# Patient Record
Sex: Male | Born: 1950 | Race: White | Hispanic: No | Marital: Married | Smoking: Former smoker
Health system: Southern US, Community
[De-identification: ages and names within clinical notes are randomized; demographics above are authoritative.]

## PROBLEM LIST (undated history)

## (undated) DIAGNOSIS — F32A Depression, unspecified: Secondary | ICD-10-CM

## (undated) DIAGNOSIS — E781 Pure hyperglyceridemia: Secondary | ICD-10-CM

## (undated) DIAGNOSIS — G609 Hereditary and idiopathic neuropathy, unspecified: Secondary | ICD-10-CM

## (undated) DIAGNOSIS — R270 Ataxia, unspecified: Secondary | ICD-10-CM

## (undated) DIAGNOSIS — L219 Seborrheic dermatitis, unspecified: Secondary | ICD-10-CM

## (undated) DIAGNOSIS — K7460 Unspecified cirrhosis of liver: Secondary | ICD-10-CM

## (undated) DIAGNOSIS — I10 Essential (primary) hypertension: Secondary | ICD-10-CM

## (undated) DIAGNOSIS — F419 Anxiety disorder, unspecified: Secondary | ICD-10-CM

## (undated) DIAGNOSIS — M109 Gout, unspecified: Secondary | ICD-10-CM

## (undated) HISTORY — DX: Hereditary and idiopathic neuropathy, unspecified: G60.9

## (undated) HISTORY — DX: Depression, unspecified: F32.A

## (undated) HISTORY — DX: Seborrheic dermatitis, unspecified: L21.9

## (undated) HISTORY — DX: Gout, unspecified: M10.9

## (undated) HISTORY — DX: Essential (primary) hypertension: I10

## (undated) HISTORY — DX: Ataxia, unspecified: R27.0

## (undated) HISTORY — DX: Pure hyperglyceridemia: E78.1

## (undated) HISTORY — DX: Unspecified cirrhosis of liver: K74.60

## (undated) HISTORY — DX: Anxiety disorder, unspecified: F41.9

---

## 1998-05-28 ENCOUNTER — Encounter: Payer: Self-pay | Admitting: Internal Medicine

## 2001-04-20 ENCOUNTER — Encounter: Payer: Self-pay | Admitting: Internal Medicine

## 2005-09-14 ENCOUNTER — Ambulatory Visit: Payer: Self-pay | Admitting: Ophthalmology

## 2005-09-27 DIAGNOSIS — R270 Ataxia, unspecified: Secondary | ICD-10-CM

## 2005-09-27 HISTORY — DX: Ataxia, unspecified: R27.0

## 2005-10-16 ENCOUNTER — Other Ambulatory Visit: Payer: Self-pay

## 2005-10-17 ENCOUNTER — Inpatient Hospital Stay: Payer: Self-pay | Admitting: Internal Medicine

## 2005-10-28 ENCOUNTER — Ambulatory Visit: Payer: Self-pay | Admitting: Internal Medicine

## 2005-11-27 ENCOUNTER — Ambulatory Visit: Payer: Self-pay | Admitting: Internal Medicine

## 2005-12-24 ENCOUNTER — Ambulatory Visit: Payer: Self-pay | Admitting: Internal Medicine

## 2006-03-29 ENCOUNTER — Ambulatory Visit: Payer: Self-pay | Admitting: Internal Medicine

## 2006-10-08 ENCOUNTER — Ambulatory Visit: Payer: Self-pay | Admitting: Internal Medicine

## 2007-02-08 DIAGNOSIS — G621 Alcoholic polyneuropathy: Secondary | ICD-10-CM | POA: Insufficient documentation

## 2007-02-08 DIAGNOSIS — G629 Polyneuropathy, unspecified: Secondary | ICD-10-CM

## 2007-02-08 DIAGNOSIS — K7460 Unspecified cirrhosis of liver: Secondary | ICD-10-CM

## 2007-02-08 DIAGNOSIS — I10 Essential (primary) hypertension: Secondary | ICD-10-CM | POA: Insufficient documentation

## 2007-02-08 DIAGNOSIS — M109 Gout, unspecified: Secondary | ICD-10-CM | POA: Insufficient documentation

## 2007-02-08 DIAGNOSIS — E781 Pure hyperglyceridemia: Secondary | ICD-10-CM | POA: Insufficient documentation

## 2007-02-11 ENCOUNTER — Ambulatory Visit: Payer: Self-pay | Admitting: Internal Medicine

## 2007-02-11 DIAGNOSIS — L570 Actinic keratosis: Secondary | ICD-10-CM | POA: Insufficient documentation

## 2007-02-14 LAB — CONVERTED CEMR LAB
ALT: 27 units/L (ref 0–53)
Alkaline Phosphatase: 88 units/L (ref 39–117)
Bilirubin, Direct: 0.1 mg/dL (ref 0.0–0.3)
CO2: 25 meq/L (ref 19–32)
Creatinine, Ser: 0.83 mg/dL (ref 0.40–1.50)
Phosphorus: 2.9 mg/dL (ref 2.3–4.6)
Sodium: 138 meq/L (ref 135–145)
Total Bilirubin: 0.5 mg/dL (ref 0.3–1.2)
Total Protein: 7.5 g/dL (ref 6.0–8.3)

## 2007-10-17 ENCOUNTER — Telehealth (INDEPENDENT_AMBULATORY_CARE_PROVIDER_SITE_OTHER): Payer: Self-pay | Admitting: *Deleted

## 2007-10-18 ENCOUNTER — Telehealth (INDEPENDENT_AMBULATORY_CARE_PROVIDER_SITE_OTHER): Payer: Self-pay | Admitting: *Deleted

## 2008-07-26 ENCOUNTER — Ambulatory Visit: Payer: Self-pay | Admitting: Internal Medicine

## 2009-01-15 ENCOUNTER — Ambulatory Visit: Payer: Self-pay | Admitting: Internal Medicine

## 2009-01-17 LAB — CONVERTED CEMR LAB
ALT: 17 units/L (ref 0–53)
AST: 27 units/L (ref 0–37)
Albumin: 4 g/dL (ref 3.5–5.2)
Alkaline Phosphatase: 59 units/L (ref 39–117)
BUN: 9 mg/dL (ref 6–23)
CO2: 28 meq/L (ref 19–32)
Chloride: 108 meq/L (ref 96–112)
Lymphocytes Relative: 30.3 % (ref 12.0–46.0)
MCHC: 33.7 g/dL (ref 30.0–36.0)
MCV: 94.1 fL (ref 78.0–100.0)
Neutrophils Relative %: 58.5 % (ref 43.0–77.0)
Phosphorus: 3.4 mg/dL (ref 2.3–4.6)
Platelets: 184 10*3/uL (ref 150.0–400.0)
Potassium: 4.3 meq/L (ref 3.5–5.1)
RBC: 4.52 M/uL (ref 4.22–5.81)
RDW: 12.1 % (ref 11.5–14.6)
Total Bilirubin: 0.7 mg/dL (ref 0.3–1.2)

## 2009-01-24 ENCOUNTER — Ambulatory Visit: Payer: Self-pay | Admitting: Internal Medicine

## 2009-02-11 ENCOUNTER — Other Ambulatory Visit: Payer: Self-pay

## 2009-07-29 ENCOUNTER — Ambulatory Visit: Payer: Self-pay | Admitting: Internal Medicine

## 2010-07-30 NOTE — Assessment & Plan Note (Signed)
Summary: F/U/CLE   Vital Signs:  Patient profile:   60 year old male Weight:      245 pounds Temp:     98.4 degrees F oral Pulse rate:   72 / minute Pulse rhythm:   regular BP sitting:   138 / 68  (left arm) Cuff size:   large  Vitals Entered By: Mervin Hack CMA Duncan Dull) (July 29, 2009 4:12 PM) CC: follow-up visit   History of Present Illness: Doing well Did get hit by garage door when he was adjusting it yesterday Sore spot but no sig bleeding Didn't knock him down or out  Weight is up 8# has gained weight from eating during holidays Mild fluid in legs May have fat but no major fluid in his abdomen  No abdominal pain  No alcohol still  Allergies: 1)  * Prednisone  Past History:  Past medical, surgical, family and social histories (including risk factors) reviewed for relevance to current acute and chronic problems.  Past Medical History: Reviewed history from 02/08/2007 and no changes required. Cirrhosis Gout Hypertension Peripheral neuropathy Hypertriglyceridemia  Past Surgical History: Reviewed history from 02/08/2007 and no changes required. Ataxia/hepatic enceph/ peripheral neuropathy 04/07  Family History: Reviewed history from 02/08/2007 and no changes required. Dad died @91  heart aneurysm, had multiple CVAs also Mom had HTN Aunt died of renal failure Cousin--suicide  Social History: Reviewed history from 02/08/2007 and no changes required. 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   Neck:  supple, no masses, and no thyromegaly.   Lungs:  normal respiratory effort and normal breath sounds.   Heart:  normal rate, regular rhythm, no murmur, and no gallop.     Abdomen:  soft, non-tender, no masses, no hepatomegaly, and no splenomegaly.  No apparent ascites Extremities:  no sig edema Psych:  normally interactive, good eye contact, not anxious appearing, and not depressed appearing.     Impression & Recommendations:  Problem # 1:  CIRRHOSIS (ICD-571.5) Assessment Comment Only doing well weight gain seems to be calories, not fluid discussed BW all fine last time--will defer till next time  His updated medication list for this problem includes:    Spironolactone 25 Mg Tabs (Spironolactone) .Marland Kitchen... 2 tablet by mouth once a day  Complete Medication List: 1)  Folic Acid 1 Mg Tabs (Folic acid) .... Take one by mouth once a day 2)  Spironolactone 25 Mg Tabs (Spironolactone) .... 2 tablet by mouth once a day 3)  B-100 Tabs (Vitamins-lipotropics) .... Take one by mouth once a day 4)  Gabapentin 300 Mg Caps (Gabapentin) .... Take one by mouth at bedtime 5)  Advil 200 Mg Caps (Ibuprofen) .... As needed  Other Orders: Flu Vaccine 29yrs + (16109) Admin 1st Vaccine (60454)  Patient Instructions: 1)  Please schedule a follow-up appointment in 6 months .   Current Allergies (reviewed today): * PREDNISONE   Immunizations Administered:  Influenza Vaccine # 1:    Vaccine Type: Fluvax 3+    Site: left deltoid    Mfr: GlaxoSmithKline    Dose: 0.5 ml    Route: IM    Given by: Mervin Hack CMA (AAMA)    Exp. Date: 12/26/2009    Lot #: [id number]    VIS  given: 01/20/07 version given July 29, 2009.  Flu Vaccine Consent Questions:    Do you have a history of severe allergic reactions to this vaccine? no    Any prior history of allergic reactions to egg and/or gelatin? no    Do you have a sensitivity to the preservative Thimersol? no    Do you have a past history of Guillan-Barre Syndrome? no    Do you currently have an acute febrile illness? no    Have you ever had a severe reaction to latex? no    Vaccine information given and explained to  patient? yes

## 2010-08-11 ENCOUNTER — Telehealth: Payer: Self-pay | Admitting: Internal Medicine

## 2010-08-20 NOTE — Progress Notes (Signed)
Summary: Need appt  Phone Note Outgoing Call   Call placed by: Mervin Hack CMA Duncan Dull),  August 11, 2010 8:08 AM Call placed to: Patient Summary of Call: calling pt to advise he needs to make a follow-up appt in order to get refills, if pt makes appt then we can give refills enough to last until appt. Initial call taken by: Mervin Hack CMA Duncan Dull),  August 11, 2010 8:14 AM  Follow-up for Phone Call        left message on machine at home for patient to return my call.  DeShannon Smith CMA (AAMA)  August 11, 2010 8:16 AM   left message on machine at home for patient to return my call.  DeShannon Smith CMA Duncan Dull)  August 12, 2010 12:57 PM   pt still haven't returned my call, will send note to pharmacy. Follow-up by: Mervin Hack CMA (AAMA),  August 13, 2010 10:45 AM

## 2010-08-21 ENCOUNTER — Encounter: Payer: Self-pay | Admitting: Internal Medicine

## 2010-08-21 ENCOUNTER — Ambulatory Visit (INDEPENDENT_AMBULATORY_CARE_PROVIDER_SITE_OTHER): Payer: Managed Care, Other (non HMO) | Admitting: Internal Medicine

## 2010-08-21 ENCOUNTER — Other Ambulatory Visit: Payer: Self-pay | Admitting: Internal Medicine

## 2010-08-21 DIAGNOSIS — I10 Essential (primary) hypertension: Secondary | ICD-10-CM

## 2010-08-21 DIAGNOSIS — G609 Hereditary and idiopathic neuropathy, unspecified: Secondary | ICD-10-CM

## 2010-08-21 DIAGNOSIS — I739 Peripheral vascular disease, unspecified: Secondary | ICD-10-CM | POA: Insufficient documentation

## 2010-08-21 DIAGNOSIS — K7460 Unspecified cirrhosis of liver: Secondary | ICD-10-CM

## 2010-08-22 LAB — HEPATIC FUNCTION PANEL
ALT: 20 U/L (ref 0–53)
Albumin: 4.5 g/dL (ref 3.5–5.2)
Total Bilirubin: 0.6 mg/dL (ref 0.3–1.2)

## 2010-08-22 LAB — CBC WITH DIFFERENTIAL/PLATELET
Basophils Absolute: 0 10*3/uL (ref 0.0–0.1)
Eosinophils Relative: 2.4 % (ref 0.0–5.0)
HCT: 43.2 % (ref 39.0–52.0)
Lymphs Abs: 1.9 10*3/uL (ref 0.7–4.0)
MCV: 93.3 fl (ref 78.0–100.0)
Monocytes Absolute: 0.3 10*3/uL (ref 0.1–1.0)
Neutro Abs: 5.2 10*3/uL (ref 1.4–7.7)
Platelets: 227 10*3/uL (ref 150.0–400.0)
RDW: 12.5 % (ref 11.5–14.6)

## 2010-08-22 LAB — RENAL FUNCTION PANEL
BUN: 9 mg/dL (ref 6–23)
CO2: 30 mEq/L (ref 19–32)
Calcium: 9.9 mg/dL (ref 8.4–10.5)
Creatinine, Ser: 1 mg/dL (ref 0.4–1.5)

## 2010-08-22 LAB — TSH: TSH: 1 u[IU]/mL (ref 0.35–5.50)

## 2010-08-26 NOTE — Assessment & Plan Note (Signed)
Summary: RENEW MEDS- FOLLOW UP   Vital Signs:  Patient profile:   60 year old male Weight:      245 pounds BMI:     36.58 Temp:     99.1 degrees F oral Pulse rate:   82 / minute Pulse rhythm:   regular BP sitting:   132 / 70  (left arm) Cuff size:   large  Vitals Entered By: Mervin Hack CMA Duncan Dull) (August 21, 2010 4:04 PM) CC: medication refill   History of Present Illness: DOIng well Has been getting dry in nose and getting nosebleeds skips the spironolactone occ and it helps did tell him neosporinn is okay  Tried to walk but has bad cramping easily able to get on bicycle though--rides around his block. Feels steady. Can go 2 miles Feet are still numb--still somewhat unstable on feet due to this does have swelling in feet if he is up for a while  No sig DOE but will tire himself on the bicycle No chest pain  No heartburn No stomach trouble  No alcohol at all for some time  Allergies: 1)  * Prednisone  Past History:  Past medical, surgical, family and social histories (including risk factors) reviewed for relevance to current acute and chronic problems.  Past Medical History: Reviewed history from 02/08/2007 and no changes required. Cirrhosis Gout Hypertension Peripheral neuropathy Hypertriglyceridemia  Past Surgical History: Reviewed history from 02/08/2007 and no changes required. Ataxia/hepatic enceph/ peripheral neuropathy 13-Oct-2022  Family History: Dad died @91  heart aneurysm, had multiple CVAs also Mom had HTN. Died @99  Aunt died of renal failure Cousin--suicide  Social History: Reviewed history from 02/08/2007 and no changes required. 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   Neck:  supple, no masses, no  thyromegaly, and no cervical lymphadenopathy.   Lungs:  normal respiratory effort, no intercostal retractions, no accessory muscle use, and normal breath sounds.   Heart:  normal rate, regular rhythm, no murmur, and no gallop.   Abdomen:  protuberant but no apparent ascites Pulses:  1+ on left not palpable on right Extremities:  no edema Psych:  normally interactive, good eye contact, not anxious appearing, and not depressed appearing.     Impression & Recommendations:  Problem # 1:  CLAUDICATION (ICD-443.9) Assessment New bilateral and left pulse strong he doesn't want further intervention regardless and doesn't want testing  Problem # 2:  PERIPHERAL NEUROPATHY (ICD-356.9) Assessment: Unchanged hard walking due to sensory problems no changes rarely uses the gabapentin  Problem # 3:  HYPERTENSION (ICD-401.9) Assessment: Unchanged  BP is fine  His updated medication list for this problem includes:    Spironolactone 25 Mg Tabs (Spironolactone) .Marland Kitchen... 2 tablet by mouth once a day  BP today: 132/70 Prior BP: 138/68 (07/29/2009)  Labs Reviewed: K+: 4.3 (01/15/2009) Creat: : 0.8 (01/15/2009)     Orders: TLB-Renal Function Panel (80069-RENAL) TLB-CBC Platelet - w/Differential (85025-CBCD) TLB-Hepatic/Liver Function Pnl (80076-HEPATIC) TLB-TSH (Thyroid Stimulating Hormone) (84443-TSH) Venipuncture (04540)  Problem # 4:  CIRRHOSIS (ICD-571.5) Assessment: Unchanged appears quiet since he is not drinking  His updated medication list for this problem includes:    Spironolactone 25 Mg Tabs (Spironolactone) .Marland Kitchen... 2 tablet by mouth once a day  Complete Medication List: 1)  Gabapentin 300 Mg  Caps (Gabapentin) .... Take one by mouth at bedtime 2)  Folic Acid 1 Mg Tabs (Folic acid) .... Take one by mouth once a day 3)  Spironolactone 25 Mg Tabs (Spironolactone) .... 2 tablet by mouth once a day 4)  B-100 Tabs (Vitamins-lipotropics) .... Take one by mouth once a day 5)  Advil 200  Mg Caps (Ibuprofen) .... As needed  Contraindications/Deferment of Procedures/Staging:    Test/Procedure: Colonoscopy    Reason for deferment: patient declined   Patient Instructions: 1)  Please schedule a follow-up appointment in 1 year.  Prescriptions: SPIRONOLACTONE 25 MG TABS (SPIRONOLACTONE) 2 tablet by mouth once a day  #60 Tablet x 11   Entered and Authorized by:   Cindee Salt MD   Signed by:   Cindee Salt MD on 08/21/2010   Method used:   Electronically to        [id number] (retail)       [address]       [place], [place]  [address]       Ph: [phone] or [phone]       Fax: [phone]   RxID:   [id number] FOLIC ACID 1 MG TABS (FOLIC ACID) Take one by mouth once a day  #30 Tablet x 11   Entered and Authorized by:   Cindee Salt MD   Signed by:   Cindee Salt MD on 08/21/2010   Method used:   Electronically to        [id number] (retail)       [address]       [place], [place]  [address]       Ph: [phone] or [phone]       Fax: [phone]   RxID:   [id number]    Orders Added: 1)  Est. Patient Level IV [60737] 2)  TLB-Renal Function Panel [80069-RENAL] 3)  TLB-CBC Platelet - w/Differential [85025-CBCD] 4)  TLB-Hepatic/Liver Function Pnl [80076-HEPATIC] 5)  TLB-TSH (Thyroid Stimulating Hormone) [84443-TSH] 6)  Venipuncture [10626]    Current Allergies (reviewed today): * PREDNISONE

## 2010-12-01 ENCOUNTER — Other Ambulatory Visit: Payer: Self-pay | Admitting: *Deleted

## 2010-12-01 MED ORDER — SPIRONOLACTONE 25 MG PO TABS: 50.0000 mg | ORAL_TABLET | Freq: Every day | ORAL | Status: DC

## 2011-01-20 ENCOUNTER — Telehealth: Payer: Self-pay | Admitting: *Deleted

## 2011-01-20 NOTE — Telephone Encounter (Signed)
Pt has dropped off a form for a handicapped placard.  He needs this by 7/31 and Dr. Alphonsus Sias wont be back until 8/06.  Form is on your desk.

## 2011-01-21 NOTE — Telephone Encounter (Signed)
Filled out, in my outbox.

## 2011-01-21 NOTE — Telephone Encounter (Signed)
Left message on machine at home for patient to pick up handicapped sticker.  Will be left at front desk.

## 2011-06-02 ENCOUNTER — Telehealth: Payer: Self-pay | Admitting: *Deleted

## 2011-06-02 NOTE — Telephone Encounter (Signed)
Patient called stating that he needs information for a health screening form that he needs to complete for his employer. Patient needs information from his last physical in January. Patient needs his height, weight, BP, glucose and cholesterol information. Please call patient with this information.

## 2011-06-04 NOTE — Telephone Encounter (Signed)
Spoke with patient and gave him all his information he needed.

## 2011-07-16 ENCOUNTER — Ambulatory Visit: Payer: Managed Care, Other (non HMO) | Admitting: Internal Medicine

## 2011-08-13 ENCOUNTER — Ambulatory Visit: Payer: Managed Care, Other (non HMO) | Admitting: Internal Medicine

## 2011-08-20 ENCOUNTER — Encounter: Payer: Self-pay | Admitting: Internal Medicine

## 2011-08-20 ENCOUNTER — Ambulatory Visit (INDEPENDENT_AMBULATORY_CARE_PROVIDER_SITE_OTHER): Payer: Managed Care, Other (non HMO) | Admitting: Internal Medicine

## 2011-08-20 VITALS — BP 148/80 | HR 79 | Temp 97.8°F | Wt 262.0 lb

## 2011-08-20 DIAGNOSIS — M109 Gout, unspecified: Secondary | ICD-10-CM

## 2011-08-20 DIAGNOSIS — Z23 Encounter for immunization: Secondary | ICD-10-CM

## 2011-08-20 DIAGNOSIS — E781 Pure hyperglyceridemia: Secondary | ICD-10-CM

## 2011-08-20 DIAGNOSIS — K7460 Unspecified cirrhosis of liver: Secondary | ICD-10-CM

## 2011-08-20 DIAGNOSIS — I10 Essential (primary) hypertension: Secondary | ICD-10-CM

## 2011-08-20 DIAGNOSIS — G609 Hereditary and idiopathic neuropathy, unspecified: Secondary | ICD-10-CM

## 2011-08-20 MED ORDER — SPIRONOLACTONE 25 MG PO TABS: 50.0000 mg | ORAL_TABLET | Freq: Every day | ORAL | Status: DC

## 2011-08-20 NOTE — Patient Instructions (Signed)
Please check to see if your insurance will cover a shingles vaccine (zostavax)

## 2011-08-20 NOTE — Assessment & Plan Note (Signed)
Has been quiet 

## 2011-08-20 NOTE — Assessment & Plan Note (Signed)
Goes back to alcohol damage Still on B vitamins No ataxia---but hard to walk exercise

## 2011-08-20 NOTE — Progress Notes (Signed)
  Subjective:    Patient ID: Caleb Arellano, male    DOB: Oct 28, 1950, 61 y.o.   MRN: [id number]  HPI Doing well  Hurt back around Thanksgiving Hasn't been doing any exercise---very sedentary  Still not drinking Occ left leg edema Still skips spironolactone but edema will worsen Gets dry eyes and mouth from the med Uses neosporin in nose  Not able to find work still Wife works On disability  Current Outpatient Prescriptions on File Prior to Visit  Medication Sig Dispense Refill  . spironolactone (ALDACTONE) 25 MG tablet Take 2 tablets (50 mg total) by mouth daily.  60 tablet  1    Allergies  Allergen Reactions  . Prednisone     REACTION: blurred vision    Past Medical History  Diagnosis Date  . Cirrhosis of liver without mention of alcohol   . Gout, unspecified   . Hypertension   . Unspecified hereditary and idiopathic peripheral neuropathy   . Pure hyperglyceridemia   . Ataxia 04/07    hepatic enceph/ peripheral neuropathy    No past surgical history on file.  Family History  Problem Relation Age of Onset  . Hypertension Mother     History   Social History  . Marital Status: Married    Spouse Name: N/A    Number of Children: 3  . Years of Education: N/A   Occupational History  . disabled    Social History Main Topics  . Smoking status: Former Smoker    Types: Cigarettes  . Smokeless tobacco: Never Used  . Alcohol Use: No     alcoholic, stopped recently  . Drug Use: No  . Sexually Active: Not on file   Other Topics Concern  . Not on file   Social History Narrative  . No narrative on file   Review of Systems Appetite is fine Sleeps well Weight up 17# Mood is okay    Objective:   Physical Exam  Constitutional: He appears well-developed and well-nourished. No distress.  Neck: Normal range of motion. Neck supple.  Cardiovascular: Normal rate, regular rhythm, normal heart sounds and intact distal pulses.  Exam reveals no gallop.   No murmur  heard. Pulmonary/Chest: Effort normal and breath sounds normal. No respiratory distress. He has no wheezes. He has no rales.  Abdominal: Soft. There is no tenderness.       Very large but appears to be fat--not ascites  Musculoskeletal: He exhibits no edema and no tenderness.  Lymphadenopathy:    He has no cervical adenopathy.  Psychiatric: He has a normal mood and affect. His behavior is normal. Judgment and thought content normal.          Assessment & Plan:

## 2011-08-20 NOTE — Assessment & Plan Note (Signed)
BP Readings from Last 3 Encounters:  08/20/11 148/80  08/21/10 132/70  07/29/09 138/68   Generally okay No meds for now

## 2011-08-20 NOTE — Assessment & Plan Note (Signed)
Still is asymptomatic Does well on the spironolactone---otherwise he holds fluid Will continue Check labs

## 2011-08-21 LAB — BASIC METABOLIC PANEL
BUN: 10 mg/dL (ref 6–23)
Calcium: 9.3 mg/dL (ref 8.4–10.5)
Chloride: 103 mEq/L (ref 96–112)
Creatinine, Ser: 0.9 mg/dL (ref 0.4–1.5)

## 2011-08-21 LAB — LIPID PANEL
HDL: 42.5 mg/dL (ref >39.00)
LDL Cholesterol: 96 mg/dL (ref 0–99)
Total CHOL/HDL Ratio: 4
Triglycerides: 144 mg/dL (ref 0.0–149.0)
VLDL: 28.8 mg/dL (ref 0.0–40.0)

## 2011-08-21 LAB — CBC WITH DIFFERENTIAL/PLATELET
Eosinophils Absolute: 0.3 10*3/uL (ref 0.0–0.7)
Eosinophils Relative: 3.1 % (ref 0.0–5.0)
Lymphocytes Relative: 23.6 % (ref 12.0–46.0)
MCV: 92 fl (ref 78.0–100.0)
Monocytes Absolute: 0.5 10*3/uL (ref 0.1–1.0)
Neutrophils Relative %: 66.9 % (ref 43.0–77.0)
Platelets: 222 10*3/uL (ref 150.0–400.0)
WBC: 8.6 10*3/uL (ref 4.5–10.5)

## 2011-08-21 LAB — HEPATIC FUNCTION PANEL
AST: 28 U/L (ref 0–37)
Bilirubin, Direct: 0.1 mg/dL (ref 0.0–0.3)
Total Bilirubin: 0.6 mg/dL (ref 0.3–1.2)

## 2011-08-21 LAB — TSH: TSH: 0.64 u[IU]/mL (ref 0.35–5.50)

## 2011-12-01 ENCOUNTER — Other Ambulatory Visit: Payer: Self-pay | Admitting: *Deleted

## 2011-12-01 MED ORDER — SPIRONOLACTONE 25 MG PO TABS: 50.0000 mg | ORAL_TABLET | Freq: Every day | ORAL | Status: DC

## 2011-12-01 MED ORDER — FOLIC ACID 1 MG PO TABS: 1.0000 mg | ORAL_TABLET | Freq: Every day | ORAL | Status: DC

## 2012-05-06 ENCOUNTER — Telehealth: Payer: Self-pay

## 2012-05-06 NOTE — Telephone Encounter (Signed)
Pt request last lab results for wellness form pt has, given to pt; pt also has to chose new insurance and wants to know what ins.  companies our office is affilitated with. Advised pt to call [phone] for that info.pt voiced understanding.

## 2012-06-08 ENCOUNTER — Ambulatory Visit (INDEPENDENT_AMBULATORY_CARE_PROVIDER_SITE_OTHER): Payer: Managed Care, Other (non HMO)

## 2012-06-08 DIAGNOSIS — Z23 Encounter for immunization: Secondary | ICD-10-CM

## 2013-02-06 ENCOUNTER — Other Ambulatory Visit: Payer: Self-pay | Admitting: Internal Medicine

## 2013-02-06 ENCOUNTER — Other Ambulatory Visit: Payer: Self-pay | Admitting: *Deleted

## 2013-02-06 NOTE — Telephone Encounter (Signed)
Okay #60 x 0 and have him set up appt within a month

## 2013-02-06 NOTE — Telephone Encounter (Signed)
[facility] for spiron[facility]actone.  Pt has not been seen since 07/2011.  Please advise if ok to refill one time.

## 2013-02-07 MED ORDER — SPIRONOLACTONE 25 MG PO TABS: 50.0000 mg | ORAL_TABLET | Freq: Every day | ORAL | Status: DC

## 2013-02-07 NOTE — Telephone Encounter (Signed)
.  left message to have patient return my call. 2nd number in chart, cell number has been disconnected Will leave message at pharmacy rx sent to pharmacy by e-script

## 2013-02-10 MED ORDER — FOLIC ACID 1 MG PO TABS: 1.0000 mg | ORAL_TABLET | Freq: Every day | ORAL | Status: DC

## 2013-02-10 NOTE — Addendum Note (Signed)
Addended by: Patience Musca on: 02/10/2013 02:55 PM   Modules accepted: Orders

## 2013-02-10 NOTE — Telephone Encounter (Addendum)
Pt left v/m; pt said had spoken with [facility] today and Folic Acid was refilled to [facility]. Pt was at [facility] and was told refill was denied. Duce request Folic Acid to be sent to [facility] and pt will call back for appt with Dr Alphonsus Sias. Pt request cb.

## 2013-02-10 NOTE — Addendum Note (Signed)
Addended by: Sueanne Margarita on: 02/10/2013 02:58 PM   Modules accepted: Orders

## 2013-04-04 ENCOUNTER — Other Ambulatory Visit: Payer: Self-pay | Admitting: *Deleted

## 2013-06-20 ENCOUNTER — Emergency Department: Payer: Self-pay | Admitting: Internal Medicine

## 2013-06-20 ENCOUNTER — Telehealth: Payer: Self-pay

## 2013-06-20 LAB — HEPATIC FUNCTION PANEL A (ARMC)
Albumin: 4.2 g/dL (ref 3.4–5.0)
Bilirubin, Direct: <0.1 mg/dL (ref 0.00–0.20)
Bilirubin,Total: 0.4 mg/dL (ref 0.2–1.0)
SGOT(AST): 29 U/L (ref 15–37)
Total Protein: 8.2 g/dL (ref 6.4–8.2)

## 2013-06-20 LAB — DIFFERENTIAL
Basophil #: 0.1 10*3/uL (ref 0.0–0.1)
Lymphocyte #: 1.6 10*3/uL (ref 1.0–3.6)
Lymphocyte %: 19.9 %
Monocyte #: 0.7 x10 3/mm (ref 0.2–1.0)

## 2013-06-20 LAB — CBC
HGB: 15.8 g/dL (ref 13.0–18.0)
MCHC: 34.1 g/dL (ref 32.0–36.0)
MCV: 92 fL (ref 80–100)
RBC: 5.06 10*6/uL (ref 4.40–5.90)
RDW: 13 % (ref 11.5–14.5)
WBC: 7.8 10*3/uL (ref 3.8–10.6)

## 2013-06-20 LAB — BASIC METABOLIC PANEL
Anion Gap: 5 — ABNORMAL LOW (ref 7–16)
BUN: 13 mg/dL (ref 7–18)
Calcium, Total: 9.3 mg/dL (ref 8.5–10.1)
Chloride: 103 mmol/L (ref 98–107)
Osmolality: 270 (ref 275–301)
Potassium: 3.5 mmol/L (ref 3.5–5.1)

## 2013-06-20 NOTE — Telephone Encounter (Signed)
Please check on him tomorrow morning 

## 2013-06-20 NOTE — Telephone Encounter (Signed)
Pt left v/m has cirrhosis of liver and is very off balanced this morning. Pt last seen 08/20/11. Pt wanted appt today with Dr Alphonsus Sias so would not have to go to ED. Left v/m for pt to cb. I called [facility] ED registration and pt is checking in to ED now.

## 2013-06-21 ENCOUNTER — Encounter: Payer: Self-pay | Admitting: Internal Medicine

## 2013-06-21 ENCOUNTER — Ambulatory Visit (INDEPENDENT_AMBULATORY_CARE_PROVIDER_SITE_OTHER): Payer: BC Managed Care – PPO | Admitting: Internal Medicine

## 2013-06-21 ENCOUNTER — Ambulatory Visit: Payer: Managed Care, Other (non HMO) | Admitting: Internal Medicine

## 2013-06-21 VITALS — BP 148/80 | HR 83 | Temp 98.3°F | Wt 259.0 lb

## 2013-06-21 DIAGNOSIS — K7460 Unspecified cirrhosis of liver: Secondary | ICD-10-CM

## 2013-06-21 DIAGNOSIS — D239 Other benign neoplasm of skin, unspecified: Secondary | ICD-10-CM

## 2013-06-21 DIAGNOSIS — Z23 Encounter for immunization: Secondary | ICD-10-CM

## 2013-06-21 DIAGNOSIS — I10 Essential (primary) hypertension: Secondary | ICD-10-CM

## 2013-06-21 DIAGNOSIS — R42 Dizziness and giddiness: Secondary | ICD-10-CM | POA: Insufficient documentation

## 2013-06-21 NOTE — Progress Notes (Signed)
Subjective:    Patient ID: Caleb Arellano, male    DOB: Nov 21, 1950, 62 y.o.   MRN: [id number]  HPI Caleb Arellano to ER yesterday  Got up 2 mornings ago---felt dizzy when leaning over to get glasses "Like the world was moving"--actually felt like he was moving Fine if stayed still, bad with movement Trouble standing up straight Felt like when his liver went bad  Trouble with reclining back or standing up To ER because it just wasn't improving No nausea or vomiting  Gait is normal for him Chronic problems with balance---still uses the cane Some headache-relates to reading (ibuprofen helps)  Current Outpatient Prescriptions on File Prior to Visit  Medication Sig Dispense Refill  . folic acid (FOLVITE) 1 MG tablet Take 1 tablet (1 mg total) by mouth daily.  30 tablet  0  . spironolactone (ALDACTONE) 25 MG tablet Take 2 tablets (50 mg total) by mouth daily.  60 tablet  0  . thiamine (VITAMIN B-1) 100 MG tablet Take 100 mg by mouth daily.       No current facility-administered medications on file prior to visit.    Allergies  Allergen Reactions  . Prednisone     REACTION: blurred vision    Past Medical History  Diagnosis Date  . Cirrhosis of liver without mention of alcohol   . Gout, unspecified   . Hypertension   . Unspecified hereditary and idiopathic peripheral neuropathy   . Pure hyperglyceridemia   . Ataxia 04/07    hepatic enceph/ peripheral neuropathy    No past surgical history on file.  Family History  Problem Relation Age of Onset  . Hypertension Mother     History   Social History  . Marital Status: Married    Spouse Name: N/A    Number of Children: 3  . Years of Education: N/A   Occupational History  . disabled    Social History Main Topics  . Smoking status: Former Smoker    Types: Cigarettes  . Smokeless tobacco: Never Used  . Alcohol Use: No     Comment: alcoholic, stopped recently  . Drug Use: No  . Sexual Activity: Not on file   Other  Topics Concern  . Not on file   Social History Narrative  . No narrative on file   Review of Systems Got bruising in left arm from automatic BP cuff yesterday Appetite is still good Weight is stable Has noticed more trouble with distance vision---needs glasses Has irritated lesion on upper chest    Objective:   Physical Exam  Constitutional: He is oriented to person, place, and time. He appears well-developed and well-nourished. No distress.  HENT:  Mouth/Throat: Oropharynx is clear and moist. No oropharyngeal exudate.  TMs and canals are normal  Eyes: Conjunctivae and EOM are normal. Pupils are equal, round, and reactive to light.  Fundi normal No nystagmus  Neck: Normal range of motion. Neck supple.  Cardiovascular: Normal rate, regular rhythm and normal heart sounds.  Exam reveals no gallop.   No murmur heard. Pulmonary/Chest: Effort normal and breath sounds normal. No respiratory distress. He has no wheezes. He has no rales.  Abdominal: Soft. There is no tenderness.  No obvious ascites  Lymphadenopathy:    He has no cervical adenopathy.  Neurological: He is alert and oriented to person, place, and time. He has normal strength. He displays no tremor. No cranial nerve deficit. He exhibits normal muscle tone. He displays a negative Romberg sign. Coordination normal.  Skin:  Mixed dermatofibroma and seb keratosis---total ~46mm on upper chest --over top of sternum  Psychiatric: He has a normal mood and affect. His behavior is normal.          Assessment & Plan:

## 2013-06-21 NOTE — Telephone Encounter (Signed)
Pt coming in today for f/u.

## 2013-06-21 NOTE — Assessment & Plan Note (Signed)
Stable Liver function okay Remains abstinent from alcohol

## 2013-06-21 NOTE — Assessment & Plan Note (Signed)
Treated with liquid nitrogen  45 seconds x 2 Discussed home care

## 2013-06-21 NOTE — Assessment & Plan Note (Signed)
BP Readings from Last 3 Encounters:  06/21/13 148/80  08/20/11 148/80  08/21/10 132/70   Reasonable control for him

## 2013-06-21 NOTE — Assessment & Plan Note (Signed)
Benign neuro exam so nothing to suggest brainstem CVA Will just use the meclizine prn (not so bad now)

## 2013-06-21 NOTE — Progress Notes (Signed)
Pre-visit discussion using our clinic review tool. No additional management support is needed unless otherwise documented below in the visit note.  

## 2013-06-27 ENCOUNTER — Other Ambulatory Visit: Payer: Self-pay | Admitting: Internal Medicine

## 2013-07-03 ENCOUNTER — Other Ambulatory Visit: Payer: Self-pay | Admitting: *Deleted

## 2013-07-03 MED ORDER — SPIRONOLACTONE 25 MG PO TABS: 50.0000 mg | ORAL_TABLET | Freq: Every day | ORAL | Status: DC

## 2013-08-29 ENCOUNTER — Other Ambulatory Visit: Payer: Self-pay | Admitting: Internal Medicine

## 2014-03-21 ENCOUNTER — Other Ambulatory Visit: Payer: Self-pay | Admitting: Internal Medicine

## 2014-04-23 ENCOUNTER — Ambulatory Visit (INDEPENDENT_AMBULATORY_CARE_PROVIDER_SITE_OTHER): Payer: BC Managed Care – PPO | Admitting: Internal Medicine

## 2014-04-23 ENCOUNTER — Encounter: Payer: Self-pay | Admitting: Internal Medicine

## 2014-04-23 VITALS — BP 158/90 | HR 74 | Temp 97.8°F | Ht 68.5 in | Wt 269.0 lb

## 2014-04-23 DIAGNOSIS — I10 Essential (primary) hypertension: Secondary | ICD-10-CM

## 2014-04-23 DIAGNOSIS — Z0001 Encounter for general adult medical examination with abnormal findings: Secondary | ICD-10-CM | POA: Insufficient documentation

## 2014-04-23 DIAGNOSIS — L219 Seborrheic dermatitis, unspecified: Secondary | ICD-10-CM | POA: Insufficient documentation

## 2014-04-23 DIAGNOSIS — F1021 Alcohol dependence, in remission: Secondary | ICD-10-CM

## 2014-04-23 DIAGNOSIS — Z23 Encounter for immunization: Secondary | ICD-10-CM

## 2014-04-23 DIAGNOSIS — Z0000 Encounter for general adult medical examination without abnormal findings: Secondary | ICD-10-CM | POA: Insufficient documentation

## 2014-04-23 DIAGNOSIS — G629 Polyneuropathy, unspecified: Secondary | ICD-10-CM

## 2014-04-23 DIAGNOSIS — K7030 Alcoholic cirrhosis of liver without ascites: Secondary | ICD-10-CM

## 2014-04-23 MED ORDER — HYDROCORTISONE 2.5 % EX CREA: TOPICAL_CREAM | Freq: Two times a day (BID) | CUTANEOUS | Status: DC | PRN

## 2014-04-23 NOTE — Patient Instructions (Addendum)
Try neutrogena T-gel shampoo for your hair.  DASH Eating Plan DASH stands for "Dietary Approaches to Stop Hypertension." The DASH eating plan is a healthy eating plan that has been shown to reduce high blood pressure (hypertension). Additional health benefits may include reducing the risk of type 2 diabetes mellitus, heart disease, and stroke. The DASH eating plan may also help with weight loss. WHAT DO I NEED TO KNOW ABOUT THE DASH EATING PLAN? For the DASH eating plan, you will follow these general guidelines:  Choose foods with a percent daily value for sodium of less than 5% (as listed on the food label).  Use salt-free seasonings or herbs instead of table salt or sea salt.  Check with your health care provider or pharmacist before using salt substitutes.  Eat lower-sodium products, often labeled as "lower sodium" or "no salt added."  Eat fresh foods.  Eat more vegetables, fruits, and low-fat dairy products.  Choose whole grains. Look for the word "whole" as the first word in the ingredient list.  Choose fish and skinless chicken or [place] more often than red meat. Limit fish, poultry, and meat to 6 oz (170 g) each day.  Limit sweets, desserts, sugars, and sugary drinks.  Choose heart-healthy fats.  Limit cheese to 1 oz (28 g) per day.  Eat more home-cooked food and less restaurant, buffet, and fast food.  Limit fried foods.  Cook foods using methods other than frying.  Limit canned vegetables. If you do use them, rinse them well to decrease the sodium.  When eating at a restaurant, ask that your food be prepared with less salt, or no salt if possible. WHAT FOODS CAN I EAT? Seek help from a dietitian for individual calorie needs. Grains Whole grain or whole wheat bread. Brown rice. Whole grain or whole wheat pasta. Quinoa, bulgur, and whole grain cereals. Low-sodium cereals. Corn or whole wheat flour tortillas. Whole grain cornbread. Whole grain crackers. Low-sodium  crackers. Vegetables Fresh or frozen vegetables (raw, steamed, roasted, or grilled). Low-sodium or reduced-sodium tomato and vegetable juices. Low-sodium or reduced-sodium tomato sauce and paste. Low-sodium or reduced-sodium canned vegetables.  Fruits All fresh, canned (in natural juice), or frozen fruits. Meat and Other Protein Products Ground beef (85% or leaner), grass-fed beef, or beef trimmed of fat. Skinless chicken or [place]. Ground chicken or [place]. Pork trimmed of fat. All fish and seafood. Eggs. Dried beans, peas, or lentils. Unsalted nuts and seeds. Unsalted canned beans. Dairy Low-fat dairy products, such as skim or 1% milk, 2% or reduced-fat cheeses, low-fat ricotta or cottage cheese, or plain low-fat yogurt. Low-sodium or reduced-sodium cheeses. Fats and Oils Tub margarines without trans fats. Light or reduced-fat mayonnaise and salad dressings (reduced sodium). Avocado. Safflower, olive, or canola oils. Natural peanut or almond butter. Other Unsalted popcorn and pretzels. The items listed above may not be a complete list of recommended foods or beverages. Contact your dietitian for more options. WHAT FOODS ARE NOT RECOMMENDED? Grains White bread. White pasta. White rice. Refined cornbread. Bagels and croissants. Crackers that contain trans fat. Vegetables Creamed or fried vegetables. Vegetables in a cheese sauce. Regular canned vegetables. Regular canned tomato sauce and paste. Regular tomato and vegetable juices. Fruits Dried fruits. Canned fruit in light or heavy syrup. Fruit juice. Meat and Other Protein Products Fatty cuts of meat. Ribs, chicken wings, bacon, sausage, bologna, salami, chitterlings, fatback, hot dogs, bratwurst, and packaged luncheon meats. Salted nuts and seeds. Canned beans with salt. Dairy Whole or 2% milk, cream, half-and-half,  and cream cheese. Whole-fat or sweetened yogurt. Full-fat cheeses or blue cheese. Nondairy creamers and whipped toppings.  Processed cheese, cheese spreads, or cheese curds. Condiments Onion and garlic salt, seasoned salt, table salt, and sea salt. Canned and packaged gravies. Worcestershire sauce. Tartar sauce. Barbecue sauce. Teriyaki sauce. Soy sauce, including reduced sodium. Steak sauce. Fish sauce. Oyster sauce. Cocktail sauce. Horseradish. Ketchup and mustard. Meat flavorings and tenderizers. Bouillon cubes. Hot sauce. Tabasco sauce. Marinades. Taco seasonings. Relishes. Fats and Oils Butter, stick margarine, lard, shortening, ghee, and bacon fat. Coconut, palm kernel, or palm oils. Regular salad dressings. Other Pickles and olives. Salted popcorn and pretzels. The items listed above may not be a complete list of foods and beverages to avoid. Contact your dietitian for more information. WHERE CAN I FIND MORE INFORMATION? National Heart, Lung, and Blood Institute: travelstabloid.com Document Released: 06/04/2011 Document Revised: 10/30/2013 Document Reviewed: 04/19/2013 [facility] Patient Information 2015 [place], [place]. This information is not intended to replace advice given to you by your health care provider. Make sure you discuss any questions you have with your health care provider. Exercise to Lose Weight Exercise and a healthy diet may help you lose weight. Your doctor may suggest specific exercises. EXERCISE IDEAS AND TIPS  Choose low-cost things you enjoy doing, such as walking, bicycling, or exercising to workout videos.  Take stairs instead of the elevator.  Walk during your lunch break.  Park your car further away from work or school.  Go to a gym or an exercise class.  Start with 5 to 10 minutes of exercise each day. Build up to 30 minutes of exercise 4 to 6 days a week.  Wear shoes with good support and comfortable clothes.  Stretch before and after working out.  Work out until you breathe harder and your heart beats faster.  Drink extra water when  you exercise.  Do not do so much that you hurt yourself, feel dizzy, or get very short of breath. Exercises that burn about 150 calories:  Running 1  miles in 15 minutes.  Playing volleyball for 45 to 60 minutes.  Washing and waxing a car for 45 to 60 minutes.  Playing touch football for 45 minutes.  Walking 1  miles in 35 minutes.  Pushing a stroller 1  miles in 30 minutes.  Playing basketball for 30 minutes.  Raking leaves for 30 minutes.  Bicycling 5 miles in 30 minutes.  Walking 2 miles in 30 minutes.  Dancing for 30 minutes.  Shoveling snow for 15 minutes.  Swimming laps for 20 minutes.  Walking up stairs for 15 minutes.  Bicycling 4 miles in 15 minutes.  Gardening for 30 to 45 minutes.  Jumping rope for 15 minutes.  Washing windows or floors for 45 to 60 minutes. Document Released: 07/18/2010 Document Revised: 09/07/2011 Document Reviewed: 07/18/2010 [facility] Patient Information 2015 [place], [place]. This information is not intended to replace advice given to you by your health care provider. Make sure you discuss any questions you have with your health care provider.

## 2014-04-23 NOTE — Assessment & Plan Note (Signed)
Seems to be better since no alcohol in some time Easy bruising but albumen good on last check  Will recheck labs

## 2014-04-23 NOTE — Assessment & Plan Note (Signed)
Inconsistent with the spironolactone  Will have him go back regularly

## 2014-04-23 NOTE — Assessment & Plan Note (Signed)
Seems stable since not drinking Has balance issues---discussed strategies for this
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
## 2014-04-23 NOTE — Assessment & Plan Note (Addendum)
Will give flu shot Will see if insurance covers zostavax--actually he is not sure he had varicella so will hold off Requests no cancer screening after discussion Needs to work on fitness and healthy eating

## 2014-04-23 NOTE — Addendum Note (Signed)
Addended by: Despina Hidden on: 04/23/2014 05:34 PM   Modules accepted: Orders

## 2014-04-23 NOTE — Progress Notes (Signed)
Pre visit review using our clinic review tool, if applicable. No additional management support is needed unless otherwise documented below in the visit note. 

## 2014-04-24 ENCOUNTER — Telehealth: Payer: Self-pay | Admitting: Internal Medicine

## 2014-04-24 ENCOUNTER — Encounter: Payer: Self-pay | Admitting: *Deleted

## 2014-04-24 DIAGNOSIS — F1021 Alcohol dependence, in remission: Secondary | ICD-10-CM | POA: Insufficient documentation

## 2014-04-24 LAB — CBC WITH DIFFERENTIAL/PLATELET
BASOS ABS: 0.1 10*3/uL (ref 0.0–0.1)
Basophils Relative: 0.8 % (ref 0.0–3.0)
Eosinophils Absolute: 0.4 10*3/uL (ref 0.0–0.7)
Eosinophils Relative: 4.8 % (ref 0.0–5.0)
HEMATOCRIT: 44 % (ref 39.0–52.0)
HEMOGLOBIN: 14.7 g/dL (ref 13.0–17.0)
LYMPHS ABS: 1.8 10*3/uL (ref 0.7–4.0)
Lymphocytes Relative: 24 % (ref 12.0–46.0)
MCHC: 33.4 g/dL (ref 30.0–36.0)
MCV: 91 fl (ref 78.0–100.0)
Monocytes Absolute: 0.8 10*3/uL (ref 0.1–1.0)
Monocytes Relative: 9.9 % (ref 3.0–12.0)
NEUTROS ABS: 4.6 10*3/uL (ref 1.4–7.7)
Neutrophils Relative %: 60.5 % (ref 43.0–77.0)
Platelets: 263 10*3/uL (ref 150.0–400.0)
RBC: 4.84 Mil/uL (ref 4.22–5.81)
RDW: 12.7 % (ref 11.5–15.5)
WBC: 7.7 10*3/uL (ref 4.0–10.5)

## 2014-04-24 LAB — COMPREHENSIVE METABOLIC PANEL
ALT: 23 U/L (ref 0–53)
AST: 31 U/L (ref 0–37)
Albumin: 3.9 g/dL (ref 3.5–5.2)
Alkaline Phosphatase: 65 U/L (ref 39–117)
BILIRUBIN TOTAL: 0.7 mg/dL (ref 0.2–1.2)
BUN: 10 mg/dL (ref 6–23)
CO2: 24 meq/L (ref 19–32)
CREATININE: 1 mg/dL (ref 0.4–1.5)
Calcium: 9.5 mg/dL (ref 8.4–10.5)
Chloride: 102 mEq/L (ref 96–112)
GFR: 77.37 mL/min (ref >60.00)
Glucose, Bld: 91 mg/dL (ref 70–99)
Potassium: 4.4 mEq/L (ref 3.5–5.1)
Sodium: 135 mEq/L (ref 135–145)
Total Protein: 8.1 g/dL (ref 6.0–8.3)

## 2014-04-24 LAB — T4, FREE: Free T4: 0.96 ng/dL (ref 0.60–1.60)

## 2014-04-24 NOTE — Telephone Encounter (Signed)
emmi mailed  °

## 2014-04-24 NOTE — Assessment & Plan Note (Signed)
Has stayed away from alcohol

## 2014-05-22 ENCOUNTER — Ambulatory Visit (INDEPENDENT_AMBULATORY_CARE_PROVIDER_SITE_OTHER): Payer: BC Managed Care – PPO | Admitting: Internal Medicine

## 2014-05-22 ENCOUNTER — Encounter: Payer: Self-pay | Admitting: Internal Medicine

## 2014-05-22 VITALS — BP 126/78 | HR 73 | Temp 98.1°F | Wt 265.0 lb

## 2014-05-22 DIAGNOSIS — R21 Rash and other nonspecific skin eruption: Secondary | ICD-10-CM

## 2014-05-22 MED ORDER — PREDNISONE 10 MG PO TABS: ORAL_TABLET | ORAL | Status: DC

## 2014-05-22 NOTE — Progress Notes (Signed)
Subjective:    Patient ID: Caleb Arellano, male    DOB: Oct 02, 1950, 63 y.o.   MRN: [id number]  HPI  Pt presents to the clinic today with c/o an allergic reaction. He reports this occurred 1 week ago. He broke out in rash on his arm and underarms. The rash has been very itchy. He has not come in contact with anything that he is allergic to that he is aware of. He has not changed lotions, soaps or detergents. He has not started any new medications. He has tried Calomine and Hydrocortisone cream without relief.  Review of Systems      Past Medical History  Diagnosis Date  . Cirrhosis of liver without mention of alcohol   . Gout, unspecified   . Hypertension   . Unspecified hereditary and idiopathic peripheral neuropathy   . Pure hyperglyceridemia   . 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 Seborrheic dermatitis     Current Outpatient Prescriptions  Medication Sig Dispense Refill  . folic acid (FOLVITE) 1 MG tablet TAKE 1 TABLET BY MOUTH EVERY DAY 30 tablet 0  . hydrocortisone 2.5 % cream Apply topically 2 (two) times daily as needed. 30 g 11  . spironolactone (ALDACTONE) 25 MG tablet Take 2 tablets (50 mg total) by mouth daily. 60 tablet 11  . thiamine (VITAMIN B-1) 100 MG tablet Take 100 mg by mouth daily.     No current facility-administered medications for this visit.    Allergies  Allergen Reactions  . Prednisone     REACTION: blurred vision    Family History  Problem Relation Age of Onset  . Hypertension Mother     History   Social History  . Marital Status: Married    Spouse Name: N/A    Number of Children: 3  . Years of Education: N/A   Occupational History  . disabled    Social History Main Topics  . Smoking status: Former Smoker    Types: Cigarettes  . Smokeless tobacco: Never Used  . Alcohol Use: No     Comment: alcoholic, stopped recently  . Drug Use: No  . Sexual Activity: Not on file   Other Topics Concern  . Not on file    Social History Narrative     Constitutional: Denies fever, malaise, fatigue, headache or abrupt weight changes.  Skin: Pt reports rash. Denies lesions or ulcercations.    No other specific complaints in a complete review of systems (except as listed in HPI above).  Objective:   Physical Exam  BP 126/78 mmHg  Pulse 73  Temp(Src) 98.1 F (36.7 C) (Oral)  Wt 265 lb (120.203 kg)  SpO2 97% Wt Readings from Last 3 Encounters:  05/22/14 265 lb (120.203 kg)  04/23/14 269 lb (122.018 kg)  06/21/13 259 lb (117.482 kg)    General: Appears his stated age, well developed, well nourished in NAD. Skin: Scattered maculopapular rash noted on upper arms and shoulders, blanchable. Cardiovascular: Normal rate and rhythm. S1,S2 noted.  No murmur, rubs or gallops noted.  Pulmonary/Chest: Normal effort and positive vesicular breath sounds. No respiratory distress. No wheezes, rales or ronchi noted.    BMET    Component Value Date/Time   NA 135 04/23/2014 1539   K 4.4 04/23/2014 1539   CL 102 04/23/2014 1539   CO2 24 04/23/2014 1539   GLUCOSE 91 04/23/2014 1539   BUN 10 04/23/2014 1539   CREATININE 1.0 04/23/2014 1539   CALCIUM 9.5  04/23/2014 1539    Lipid Panel     Component Value Date/Time   CHOL 167 08/20/2011 1643   TRIG 144.0 08/20/2011 1643   HDL 42.50 08/20/2011 1643   CHOLHDL 4 08/20/2011 1643   VLDL 28.8 08/20/2011 1643   LDLCALC 96 08/20/2011 1643    CBC    Component Value Date/Time   WBC 7.7 04/23/2014 1539   RBC 4.84 04/23/2014 1539   HGB 14.7 04/23/2014 1539   HCT 44.0 04/23/2014 1539   PLT 263.0 04/23/2014 1539   MCV 91.0 04/23/2014 1539   MCHC 33.4 04/23/2014 1539   RDW 12.7 04/23/2014 1539   LYMPHSABS 1.8 04/23/2014 1539   MONOABS 0.8 04/23/2014 1539   EOSABS 0.4 04/23/2014 1539   BASOSABS 0.1 04/23/2014 1539    Hgb A1C No results found for: HGBA1C       Assessment & Plan:   Rash:  ? Cause Will give prednisone taper- he insist that he  does not have an allergy to this. He understands the consequences of taking a medication with a documented allergy. He agrees to continue with prednisone 25 mg Benadryl QHS  RTC as needed or if symptoms persist or worsen

## 2014-05-22 NOTE — Patient Instructions (Signed)

## 2014-05-22 NOTE — Progress Notes (Signed)
Pre visit review using our clinic review tool, if applicable. No additional management support is needed unless otherwise documented below in the visit note. 

## 2014-07-12 ENCOUNTER — Other Ambulatory Visit: Payer: Self-pay | Admitting: Internal Medicine

## 2015-01-05 ENCOUNTER — Other Ambulatory Visit: Payer: Self-pay | Admitting: Internal Medicine

## 2015-01-09 ENCOUNTER — Other Ambulatory Visit: Payer: Self-pay | Admitting: Internal Medicine

## 2015-03-11 ENCOUNTER — Encounter: Payer: Self-pay | Admitting: Internal Medicine

## 2015-03-11 ENCOUNTER — Ambulatory Visit (INDEPENDENT_AMBULATORY_CARE_PROVIDER_SITE_OTHER): Payer: BLUE CROSS/BLUE SHIELD | Admitting: Internal Medicine

## 2015-03-11 VITALS — BP 130/70 | HR 85 | Temp 97.7°F | Wt 276.0 lb

## 2015-03-11 DIAGNOSIS — G629 Polyneuropathy, unspecified: Secondary | ICD-10-CM | POA: Diagnosis not present

## 2015-03-11 DIAGNOSIS — Z23 Encounter for immunization: Secondary | ICD-10-CM | POA: Diagnosis not present

## 2015-03-11 DIAGNOSIS — I10 Essential (primary) hypertension: Secondary | ICD-10-CM

## 2015-03-11 DIAGNOSIS — F1021 Alcohol dependence, in remission: Secondary | ICD-10-CM

## 2015-03-11 DIAGNOSIS — L219 Seborrheic dermatitis, unspecified: Secondary | ICD-10-CM

## 2015-03-11 MED ORDER — KETOCONAZOLE 2 % EX SHAM: 1.0000 "application " | MEDICATED_SHAMPOO | CUTANEOUS | Status: AC

## 2015-03-11 MED ORDER — ZOSTER VACCINE LIVE 19400 UNT/0.65ML ~~LOC~~ SOLR: 0.6500 mL | Freq: Once | SUBCUTANEOUS | Status: DC

## 2015-03-11 NOTE — Assessment & Plan Note (Signed)
May be worsened somewhat Walking okay with a walking stick for balance

## 2015-03-11 NOTE — Progress Notes (Signed)
   Subjective:    Patient ID: Caleb Arellano, male    DOB: 06/02/1951, 64 y.o.   MRN: [id number]  HPI Here for follow up of chronic medical conditions  Some recurrent hives No clear inciting event benedryl helped but knocks him out Cortisone cream helps Discussed trying non sedating antihistamine (he asks about allegra) No mouth swelling or breathing problems  Wart on bottom lip, left forehead, occiput and abdomen--checked all of them and none appear to be actinics. Some seborrhea on scalp still  Still not drinking Not at AA or any other specific interventions  Legs feel more numb Uses walking stick   No chest pain No SOB No dizziness or syncope  Current Outpatient Prescriptions on File Prior to Visit  Medication Sig Dispense Refill  . folic acid (FOLVITE) 1 MG tablet TAKE 1 TABLET BY MOUTH EVERY DAY 30 tablet 5  . hydrocortisone 2.5 % cream Apply topically 2 (two) times daily as needed. 30 g 11  . spironolactone (ALDACTONE) 25 MG tablet TAKE 2 TABLETS BY MOUTH EVERY DAY 60 tablet 2  . thiamine (VITAMIN B-1) 100 MG tablet Take 100 mg by mouth daily.     No current facility-administered medications on file prior to visit.    Allergies  Allergen Reactions  . Prednisone     REACTION: blurred vision    Past Medical History  Diagnosis Date  . Cirrhosis of liver without mention of alcohol   . Gout, unspecified   . Hypertension   . Unspecified hereditary and idiopathic peripheral neuropathy   . Pure hyperglyceridemia   . 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 Seborrheic dermatitis     No past surgical history on file.  Family History  Problem Relation Age of Onset  . Hypertension Mother     Social History   Social History  . Marital Status: Married    Spouse Name: N/A  . Number of Children: 3  . Years of Education: N/A   Occupational History  . disabled    Social History Main Topics  . Smoking status: Former Smoker    Types:  Cigarettes  . Smokeless tobacco: Never Used  . Alcohol Use: No     Comment: alcoholic, stopped recently  . Drug Use: No  . Sexual Activity: Not on file   Other Topics Concern  . Not on file   Social History Narrative   Review of Systems Gained 11# since last visit here Hasn't been doing any exercise Still has sense of movement if he turns his head a certain way    Objective:   Physical Exam  Constitutional: He appears well-developed and well-nourished. No distress.  Cardiovascular: Normal rate, regular rhythm and normal heart sounds.  Exam reveals no gallop.   No murmur heard. Pulmonary/Chest: Effort normal and breath sounds normal. No respiratory distress. He has no wheezes. He has no rales.  Abdominal: Soft. There is no tenderness.  Musculoskeletal: He exhibits no edema.  Skin:  Seborrhea on occiput  Psychiatric: He has a normal mood and affect. His behavior is normal.          Assessment & Plan:

## 2015-03-11 NOTE — Addendum Note (Signed)
Addended by: Despina Hidden on: 03/11/2015 05:18 PM   Modules accepted: Orders

## 2015-03-11 NOTE — Progress Notes (Signed)
Pre visit review using our clinic review tool, if applicable. No additional management support is needed unless otherwise documented below in the visit note. 

## 2015-03-11 NOTE — Assessment & Plan Note (Signed)
BP Readings from Last 3 Encounters:  03/11/15 130/70  05/22/14 126/78  04/23/14 158/90   Good control with just the aldactone

## 2015-03-11 NOTE — Assessment & Plan Note (Signed)
Has kept off all alcohol

## 2015-03-11 NOTE — Assessment & Plan Note (Signed)
Will give ketoconazole shampoo

## 2015-10-11 ENCOUNTER — Other Ambulatory Visit: Payer: Self-pay | Admitting: Internal Medicine

## 2015-11-11 ENCOUNTER — Other Ambulatory Visit: Payer: Self-pay | Admitting: Internal Medicine

## 2015-11-11 NOTE — Telephone Encounter (Signed)
Left a message for patient. He was seen in Sept 2016 and was advised to return in 3 months. Pt has not been seen. He needs to schedule an OV before I can refill his Spironolactone.

## 2015-12-14 ENCOUNTER — Other Ambulatory Visit: Payer: Self-pay | Admitting: Internal Medicine

## 2015-12-16 NOTE — Telephone Encounter (Signed)
Patient returned Caleb Arellano's call.  Please call patient back at [phone].

## 2015-12-16 NOTE — Telephone Encounter (Signed)
Spoke to pt. Made appt for July 10. Sent in refills

## 2015-12-16 NOTE — Telephone Encounter (Signed)
Left message to call office. He was last seen 03-11-15. He was advised to follow-up in 6 months, which was in March. His last refills stated he needed an OV before the next refill. He needs to schedule an appointment to F/U HTN.

## 2016-01-06 ENCOUNTER — Encounter: Payer: Self-pay | Admitting: Internal Medicine

## 2016-01-06 ENCOUNTER — Ambulatory Visit (INDEPENDENT_AMBULATORY_CARE_PROVIDER_SITE_OTHER): Payer: 59 | Admitting: Internal Medicine

## 2016-01-06 VITALS — BP 140/86 | HR 78 | Temp 98.1°F | Wt 282.0 lb

## 2016-01-06 DIAGNOSIS — K7030 Alcoholic cirrhosis of liver without ascites: Secondary | ICD-10-CM

## 2016-01-06 DIAGNOSIS — Z23 Encounter for immunization: Secondary | ICD-10-CM

## 2016-01-06 DIAGNOSIS — G629 Polyneuropathy, unspecified: Secondary | ICD-10-CM

## 2016-01-06 DIAGNOSIS — I10 Essential (primary) hypertension: Secondary | ICD-10-CM

## 2016-01-06 DIAGNOSIS — Z6841 Body Mass Index (BMI) 40.0 and over, adult: Secondary | ICD-10-CM | POA: Insufficient documentation

## 2016-01-06 DIAGNOSIS — Z0000 Encounter for general adult medical examination without abnormal findings: Secondary | ICD-10-CM

## 2016-01-06 DIAGNOSIS — F1021 Alcohol dependence, in remission: Secondary | ICD-10-CM

## 2016-01-06 LAB — CBC WITH DIFFERENTIAL/PLATELET
BASOS ABS: 0.1 10*3/uL (ref 0.0–0.1)
Basophils Relative: 0.7 % (ref 0.0–3.0)
EOS PCT: 2.9 % (ref 0.0–5.0)
Eosinophils Absolute: 0.2 10*3/uL (ref 0.0–0.7)
HEMATOCRIT: 45.2 % (ref 39.0–52.0)
Hemoglobin: 15.2 g/dL (ref 13.0–17.0)
LYMPHS ABS: 2.2 10*3/uL (ref 0.7–4.0)
LYMPHS PCT: 27.8 % (ref 12.0–46.0)
MCHC: 33.7 g/dL (ref 30.0–36.0)
MCV: 89.7 fl (ref 78.0–100.0)
MONOS PCT: 10.3 % (ref 3.0–12.0)
Monocytes Absolute: 0.8 10*3/uL (ref 0.1–1.0)
NEUTROS ABS: 4.5 10*3/uL (ref 1.4–7.7)
NEUTROS PCT: 58.3 % (ref 43.0–77.0)
PLATELETS: 267 10*3/uL (ref 150.0–400.0)
RBC: 5.04 Mil/uL (ref 4.22–5.81)
RDW: 13.2 % (ref 11.5–15.5)
WBC: 7.8 10*3/uL (ref 4.0–10.5)

## 2016-01-06 LAB — COMPREHENSIVE METABOLIC PANEL
ALT: 22 U/L (ref 0–53)
AST: 24 U/L (ref 0–37)
Albumin: 4.5 g/dL (ref 3.5–5.2)
Alkaline Phosphatase: 57 U/L (ref 39–117)
BILIRUBIN TOTAL: 0.4 mg/dL (ref 0.2–1.2)
BUN: 13 mg/dL (ref 6–23)
CALCIUM: 10.1 mg/dL (ref 8.4–10.5)
CHLORIDE: 102 meq/L (ref 96–112)
CO2: 29 meq/L (ref 19–32)
Creatinine, Ser: 0.98 mg/dL (ref 0.40–1.50)
GFR: 81.5 mL/min (ref >60.00)
GLUCOSE: 103 mg/dL — AB (ref 70–99)
Potassium: 4.5 mEq/L (ref 3.5–5.1)
Sodium: 137 mEq/L (ref 135–145)
TOTAL PROTEIN: 7.9 g/dL (ref 6.0–8.3)

## 2016-01-06 NOTE — Assessment & Plan Note (Signed)
Remains abstinent 

## 2016-01-06 NOTE — Addendum Note (Signed)
Addended by: Daralene Milch C on: 01/06/2016 02:15 PM   Modules accepted: SmartSet

## 2016-01-06 NOTE — Assessment & Plan Note (Signed)
Doesn't appear to be holding fluid Intermittent with the spironolactone but back on now

## 2016-01-06 NOTE — Addendum Note (Signed)
Addended by: Pilar Grammes on: 01/06/2016 12:55 PM   Modules accepted: Orders, SmartSet

## 2016-01-06 NOTE — Assessment & Plan Note (Signed)
Info given about eating plan and counseled on trying at least some regular activity

## 2016-01-06 NOTE — Assessment & Plan Note (Signed)
prevnar Discussed cancer screening--he doesn't want to do this

## 2016-01-06 NOTE — Patient Instructions (Signed)
DASH Eating Plan  DASH stands for "Dietary Approaches to Stop Hypertension." The DASH eating plan is a healthy eating plan that has been shown to reduce high blood pressure (hypertension). Additional health benefits may include reducing the risk of type 2 diabetes mellitus, heart disease, and stroke. The DASH eating plan may also help with weight loss.  WHAT DO I NEED TO KNOW ABOUT THE DASH EATING PLAN?  For the DASH eating plan, you will follow these general guidelines:  · Choose foods with a percent daily value for sodium of less than 5% (as listed on the food label).  · Use salt-free seasonings or herbs instead of table salt or sea salt.  · Check with your health care provider or pharmacist before using salt substitutes.  · Eat lower-sodium products, often labeled as "lower sodium" or "no salt added."  · Eat fresh foods.  · Eat more vegetables, fruits, and low-fat dairy products.  · Choose whole grains. Look for the word "whole" as the first word in the ingredient list.  · Choose fish and skinless chicken or turkey more often than red meat. Limit fish, poultry, and meat to 6 oz (170 g) each day.  · Limit sweets, desserts, sugars, and sugary drinks.  · Choose heart-healthy fats.  · Limit cheese to 1 oz (28 g) per day.  · Eat more home-cooked food and less restaurant, buffet, and fast food.  · Limit fried foods.  · Cook foods using methods other than frying.  · Limit canned vegetables. If you do use them, rinse them well to decrease the sodium.  · When eating at a restaurant, ask that your food be prepared with less salt, or no salt if possible.  WHAT FOODS CAN I EAT?  Seek help from a dietitian for individual calorie needs.  Grains  Whole grain or whole wheat bread. Brown rice. Whole grain or whole wheat pasta. Quinoa, bulgur, and whole grain cereals. Low-sodium cereals. Corn or whole wheat flour tortillas. Whole grain cornbread. Whole grain crackers. Low-sodium crackers.  Vegetables  Fresh or frozen vegetables  (raw, steamed, roasted, or grilled). Low-sodium or reduced-sodium tomato and vegetable juices. Low-sodium or reduced-sodium tomato sauce and paste. Low-sodium or reduced-sodium canned vegetables.   Fruits  All fresh, canned (in natural juice), or frozen fruits.  Meat and Other Protein Products  Ground beef (85% or leaner), grass-fed beef, or beef trimmed of fat. Skinless chicken or turkey. Ground chicken or turkey. Pork trimmed of fat. All fish and seafood. Eggs. Dried beans, peas, or lentils. Unsalted nuts and seeds. Unsalted canned beans.  Dairy  Low-fat dairy products, such as skim or 1% milk, 2% or reduced-fat cheeses, low-fat ricotta or cottage cheese, or plain low-fat yogurt. Low-sodium or reduced-sodium cheeses.  Fats and Oils  Tub margarines without trans fats. Light or reduced-fat mayonnaise and salad dressings (reduced sodium). Avocado. Safflower, olive, or canola oils. Natural peanut or almond butter.  Other  Unsalted popcorn and pretzels.  The items listed above may not be a complete list of recommended foods or beverages. Contact your dietitian for more options.  WHAT FOODS ARE NOT RECOMMENDED?  Grains  White bread. White pasta. White rice. Refined cornbread. Bagels and croissants. Crackers that contain trans fat.  Vegetables  Creamed or fried vegetables. Vegetables in a cheese sauce. Regular canned vegetables. Regular canned tomato sauce and paste. Regular tomato and vegetable juices.  Fruits  Dried fruits. Canned fruit in light or heavy syrup. Fruit juice.  Meat and Other Protein   Products  Fatty cuts of meat. Ribs, chicken wings, bacon, sausage, bologna, salami, chitterlings, fatback, hot dogs, bratwurst, and packaged luncheon meats. Salted nuts and seeds. Canned beans with salt.  Dairy  Whole or 2% milk, cream, half-and-half, and cream cheese. Whole-fat or sweetened yogurt. Full-fat cheeses or blue cheese. Nondairy creamers and whipped toppings. Processed cheese, cheese spreads, or cheese  curds.  Condiments  Onion and garlic salt, seasoned salt, table salt, and sea salt. Canned and packaged gravies. Worcestershire sauce. Tartar sauce. Barbecue sauce. Teriyaki sauce. Soy sauce, including reduced sodium. Steak sauce. Fish sauce. Oyster sauce. Cocktail sauce. Horseradish. Ketchup and mustard. Meat flavorings and tenderizers. Bouillon cubes. Hot sauce. Tabasco sauce. Marinades. Taco seasonings. Relishes.  Fats and Oils  Butter, stick margarine, lard, shortening, ghee, and bacon fat. Coconut, palm kernel, or palm oils. Regular salad dressings.  Other  Pickles and olives. Salted popcorn and pretzels.  The items listed above may not be a complete list of foods and beverages to avoid. Contact your dietitian for more information.  WHERE CAN I FIND MORE INFORMATION?  National Heart, Lung, and [employer] Institute: www.nhlbi.nih.gov/health/health-topics/topics/dash/     This information is not intended to replace advice given to you by your health care provider. Make sure you discuss any questions you have with your health care provider.     Document Released: 06/04/2011 Document Revised: 07/06/2014 Document Reviewed: 04/19/2013  Elsevier Interactive Patient Education ©2016 Else[employer] Inc.

## 2016-01-06 NOTE — Progress Notes (Signed)
   Subjective:    Patient ID: Caleb Arellano, male    DOB: 03-16-1951, 65 y.o.   MRN: [id number]  HPI Here for follow up of chronic health conditions  Lost insurance for a while when wife retired She went back to work part time-- and has insurance again  Weight is up 6# Legs are more full but he relates this to not being careful about his eating Doesn't seem to have increased edema Already is down 3# from last week when being more careful  Still has pain in legs Had been more of numbness--but now more of burning Not bad enough to try medications  Remains abstinent No alcohol in years now No AA or other actions  Current Outpatient Prescriptions on File Prior to Visit  Medication Sig Dispense Refill  . folic acid (FOLVITE) 1 MG tablet TAKE 1 TABLET BY MOUTH EVERY DAY. **NEEDS OFFICE VISIT FOR FURTHER FILLS* 30 tablet 0  . hydrocortisone 2.5 % cream Apply topically 2 (two) times daily as needed. 30 g 11  . ketoconazole (NIZORAL) 2 % shampoo Apply 1 application topically 2 (two) times a week. 120 mL 11  . spironolactone (ALDACTONE) 25 MG tablet TAKE 2 TABLETS BY MOUTH EVERY DAY **NEEDS OFFICE VISIT FOR FURTHER FILLS* 60 tablet 0  . thiamine (VITAMIN B-1) 100 MG tablet Take 100 mg by mouth daily.     No current facility-administered medications on file prior to visit.    No Active Allergies  Past Medical History  Diagnosis Date  . Cirrhosis of liver without mention of alcohol   . Gout, unspecified   . Hypertension   . Unspecified hereditary and idiopathic peripheral neuropathy   . Pure hyperglyceridemia   . 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 Seborrheic dermatitis     No past surgical history on file.  Family History  Problem Relation Age of Onset  . Hypertension Mother     Social History   Social History  . Marital Status: Married    Spouse Name: N/A  . Number of Children: 3  . Years of Education: N/A   Occupational History  . disabled      Social History Main Topics  . Smoking status: Former Smoker    Types: Cigarettes  . Smokeless tobacco: Never Used  . Alcohol Use: No     Comment: alcoholic, stopped recently  . Drug Use: No  . Sexual Activity: Not on file   Other Topics Concern  . Not on file   Social History Narrative   Review of Systems  Sleeps well No SOB--but harder to do stuff in the heat No much exercise Bowels are fine Voids well--no problems with stream Gout has been quiet    Objective:   Physical Exam  Constitutional: He appears well-developed and well-nourished. No distress.  Neck: Normal range of motion. Neck supple.  Cardiovascular: Normal rate, regular rhythm, normal heart sounds and intact distal pulses.  Exam reveals no gallop.   No murmur heard. Pulmonary/Chest: Effort normal and breath sounds normal. No respiratory distress. He has no wheezes. He has no rales.  Abdominal: Soft. There is no tenderness.  No apparent ascites  Musculoskeletal:  No sig edema  Lymphadenopathy:    He has no cervical adenopathy.  Skin: No rash noted. No erythema.  Psychiatric: He has a normal mood and affect. His behavior is normal.          Assessment & Plan:

## 2016-01-06 NOTE — Assessment & Plan Note (Signed)
More pain now Prefers no meds still--- but can consider gabapentin only at night if worsens (made him logy)

## 2016-01-06 NOTE — Progress Notes (Signed)
Pre visit review using our clinic review tool, if applicable. No additional management support is needed unless otherwise documented below in the visit note. 

## 2016-01-06 NOTE — Assessment & Plan Note (Signed)
BP Readings from Last 3 Encounters:  01/06/16 140/86  03/11/15 130/70  05/22/14 126/78   BP acceptable  No changes

## 2016-01-13 ENCOUNTER — Encounter: Payer: Self-pay | Admitting: Internal Medicine

## 2016-01-15 DIAGNOSIS — L718 Other rosacea: Secondary | ICD-10-CM | POA: Diagnosis not present

## 2016-01-15 DIAGNOSIS — H353131 Nonexudative age-related macular degeneration, bilateral, early dry stage: Secondary | ICD-10-CM | POA: Diagnosis not present

## 2016-01-15 DIAGNOSIS — H01019 Ulcerative blepharitis unspecified eye, unspecified eyelid: Secondary | ICD-10-CM | POA: Diagnosis not present

## 2016-01-18 ENCOUNTER — Other Ambulatory Visit: Payer: Self-pay | Admitting: Internal Medicine

## 2016-11-30 DIAGNOSIS — H01022 Squamous blepharitis right lower eyelid: Secondary | ICD-10-CM | POA: Diagnosis not present

## 2016-11-30 DIAGNOSIS — H2513 Age-related nuclear cataract, bilateral: Secondary | ICD-10-CM | POA: Diagnosis not present

## 2016-11-30 DIAGNOSIS — H01025 Squamous blepharitis left lower eyelid: Secondary | ICD-10-CM | POA: Diagnosis not present

## 2016-11-30 DIAGNOSIS — H524 Presbyopia: Secondary | ICD-10-CM | POA: Diagnosis not present

## 2016-11-30 DIAGNOSIS — H25011 Cortical age-related cataract, right eye: Secondary | ICD-10-CM | POA: Diagnosis not present

## 2017-03-30 ENCOUNTER — Encounter: Payer: Self-pay | Admitting: Internal Medicine

## 2017-03-30 ENCOUNTER — Ambulatory Visit (INDEPENDENT_AMBULATORY_CARE_PROVIDER_SITE_OTHER): Payer: Medicare Other | Admitting: Internal Medicine

## 2017-03-30 VITALS — BP 136/64 | HR 62 | Temp 97.6°F | Ht 70.0 in | Wt 264.0 lb

## 2017-03-30 DIAGNOSIS — G629 Polyneuropathy, unspecified: Secondary | ICD-10-CM

## 2017-03-30 DIAGNOSIS — Z23 Encounter for immunization: Secondary | ICD-10-CM | POA: Diagnosis not present

## 2017-03-30 DIAGNOSIS — I10 Essential (primary) hypertension: Secondary | ICD-10-CM | POA: Diagnosis not present

## 2017-03-30 DIAGNOSIS — Z7189 Other specified counseling: Secondary | ICD-10-CM | POA: Insufficient documentation

## 2017-03-30 DIAGNOSIS — R42 Dizziness and giddiness: Secondary | ICD-10-CM

## 2017-03-30 DIAGNOSIS — K7030 Alcoholic cirrhosis of liver without ascites: Secondary | ICD-10-CM

## 2017-03-30 DIAGNOSIS — Z0000 Encounter for general adult medical examination without abnormal findings: Secondary | ICD-10-CM

## 2017-03-30 DIAGNOSIS — F1021 Alcohol dependence, in remission: Secondary | ICD-10-CM

## 2017-03-30 DIAGNOSIS — F39 Unspecified mood [affective] disorder: Secondary | ICD-10-CM

## 2017-03-30 LAB — COMPREHENSIVE METABOLIC PANEL
ALK PHOS: 53 U/L (ref 39–117)
ALT: 15 U/L (ref 0–53)
AST: 18 U/L (ref 0–37)
Albumin: 4.4 g/dL (ref 3.5–5.2)
BILIRUBIN TOTAL: 0.5 mg/dL (ref 0.2–1.2)
BUN: 14 mg/dL (ref 6–23)
CO2: 28 mEq/L (ref 19–32)
Calcium: 9.6 mg/dL (ref 8.4–10.5)
Chloride: 103 mEq/L (ref 96–112)
Creatinine, Ser: 0.9 mg/dL (ref 0.40–1.50)
GFR: 89.58 mL/min (ref >60.00)
Glucose, Bld: 96 mg/dL (ref 70–99)
POTASSIUM: 3.9 meq/L (ref 3.5–5.1)
Sodium: 138 mEq/L (ref 135–145)
Total Protein: 7.8 g/dL (ref 6.0–8.3)

## 2017-03-30 LAB — CBC WITH DIFFERENTIAL/PLATELET
BASOS ABS: 0.1 10*3/uL (ref 0.0–0.1)
Basophils Relative: 0.8 % (ref 0.0–3.0)
EOS PCT: 3 % (ref 0.0–5.0)
Eosinophils Absolute: 0.2 10*3/uL (ref 0.0–0.7)
HCT: 46.3 % (ref 39.0–52.0)
HEMOGLOBIN: 15.4 g/dL (ref 13.0–17.0)
Lymphocytes Relative: 20.5 % (ref 12.0–46.0)
Lymphs Abs: 1.5 10*3/uL (ref 0.7–4.0)
MCHC: 33.2 g/dL (ref 30.0–36.0)
MCV: 92.6 fl (ref 78.0–100.0)
MONO ABS: 0.7 10*3/uL (ref 0.1–1.0)
Monocytes Relative: 8.9 % (ref 3.0–12.0)
NEUTROS PCT: 66.8 % (ref 43.0–77.0)
Neutro Abs: 5 10*3/uL (ref 1.4–7.7)
PLATELETS: 239 10*3/uL (ref 150.0–400.0)
RBC: 5 Mil/uL (ref 4.22–5.81)
RDW: 13.2 % (ref 11.5–15.5)
WBC: 7.5 10*3/uL (ref 4.0–10.5)

## 2017-03-30 MED ORDER — MECLIZINE HCL 25 MG PO TABS: 25.0000 mg | ORAL_TABLET | Freq: Three times a day (TID) | ORAL | 11 refills | Status: DC | PRN

## 2017-03-30 MED ORDER — SPIRONOLACTONE 25 MG PO TABS: 50.0000 mg | ORAL_TABLET | ORAL | 11 refills | Status: DC

## 2017-03-30 NOTE — Progress Notes (Signed)
Subjective:    Patient ID: Caleb Arellano, male    DOB: August 22, 1950, 66 y.o.   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) Vision is okay--some trouble with right eye though Independent with instrumental ADLs Mild memory issues--nothing worrisome  Having trouble with vertigo--- if he walks looking down Goes back 3-4 months and mildly improving If he lies on his back---can get sensation of tilting Chronic tinnitus Did get large piece of wax out of left ear--hearing better after this came out [facility] okay as long as he is looking forward Ongoing neuropathy-- still has to be careful turning his head, etc  Continues to be abstinent from alcohol No confusion Still gets some swelling in his ankles Has cut back the spironolactone to every other day  No chest pain No SOB No syncope  Mild anxiety Certain things set him off  Occasional anxiety attacks---doesn't want medications  Current Outpatient Prescriptions on File Prior to Visit  Medication Sig Dispense Refill  . folic acid (FOLVITE) 1 MG tablet Take 1 tablet (1 mg total) by mouth daily. 30 tablet 11  . hydrocortisone 2.5 % cream Apply topically 2 (two) times daily as needed. 30 g 11  . ketoconazole (NIZORAL) 2 % shampoo Apply 1 application topically 2 (two) times a week. 120 mL 11  . thiamine (VITAMIN B-1) 100 MG tablet Take 100 mg by mouth daily.     No current facility-administered medications on file prior to visit.     No Active Allergies  Past Medical History:  Diagnosis Date  . 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 Cirrhosis of liver without mention of alcohol   . Gout,  unspecified   . Hypertension   . Pure hyperglyceridemia   . Seborrheic dermatitis   . Unspecified hereditary and idiopathic peripheral neuropathy     No past surgical history on file.  Family History  Problem Relation Age of Onset  . Hypertension Mother   . Diabetes Mother   . Cancer Sister        1 sister with blood cancer, 1 with breast cancer  . Heart disease Neg Hx     Social History   Social History  . Marital status: Married    Spouse name: N/A  . Number of children: 3  . Years of education: N/A   Occupational History  . disabled    Social History Main Topics  . Smoking status: Former Smoker    Types: Cigarettes  . Smokeless tobacco: Never Used  . Alcohol use No     Comment: alcoholic, stopped recently  . Drug use: No  . Sexual activity: Not on file   Other Topics Concern  . Not on file   Social History Narrative   No living will   Wife, then daughter [name], would be medical decision maker   Would accept resuscitation   No prolonged tube feeds if cognitively unaware   Review of Systems Appetite is good Weight is down from last year--cut back on bread Sleeps fairly well Wears seat belt Teeth are okay--has needed some work Bowels are fine. No blood Urine stream is fine. Some AM urgency and then delay, otherwise fine Same seborrhea--uses the hydrocortisone cream  Chronic papule on lower lip--no change in past years No sig back or joint pains. Just pain in right 2nd finger (arthritis) and will occasionally "throw my back out"    Objective:   Physical Exam  Constitutional: He is oriented to person, place, and time. He appears well-developed. No distress.  HENT:  Mouth/Throat: Oropharynx is clear and moist. No oropharyngeal exudate.  No sig cerumen  Neck: No thyromegaly present.  Cardiovascular: Normal rate, regular rhythm, normal heart sounds and intact distal pulses.  Exam reveals no gallop.   No murmur heard. Pulmonary/Chest: Effort normal and  breath sounds normal. No respiratory distress. He has no wheezes. He has no rales.  gynecomastia  Abdominal: Soft. There is no tenderness.  Musculoskeletal: He exhibits no edema.  Lymphadenopathy:    He has no cervical adenopathy.  Neurological: He is alert and oriented to person, place, and time.  President-- "Daisy Floro, Obama, Bush" [id number] Recall 3/3  Gait is normal  Skin: No rash noted.  Psychiatric: He has a normal mood and affect. His behavior is normal.          Assessment & Plan:

## 2017-03-30 NOTE — Assessment & Plan Note (Signed)
See social history 

## 2017-03-30 NOTE — Assessment & Plan Note (Signed)
Seems quiet Uses the spironolactone every other day Discussed screening for hepatic cancer--he wants to hold off

## 2017-03-30 NOTE — Assessment & Plan Note (Signed)
Makes walking difficult No Rx

## 2017-03-30 NOTE — Addendum Note (Signed)
Addended by: Verlene Mayer A on: 03/30/2017 12:35 PM   Modules accepted: Orders

## 2017-03-30 NOTE — Assessment & Plan Note (Signed)
Long standing abstinence 

## 2017-03-30 NOTE — Assessment & Plan Note (Signed)
Clearly seems vestibular Will try meclizine ENT if worsens

## 2017-03-30 NOTE — Assessment & Plan Note (Signed)
BP Readings from Last 3 Encounters:  03/30/17 136/64  01/06/16 140/86  03/11/15 130/70   Okay without specific Rx

## 2017-03-30 NOTE — Assessment & Plan Note (Signed)
I have personally reviewed the Medicare Annual Wellness questionnaire and have noted 1. The patient's medical and social history 2. Their use of alcohol, tobacco or illicit drugs 3. Their current medications and supplements 4. The patient's functional ability including ADL's, fall risks, home safety risks and hearing or visual             impairment. 5. Diet and physical activities 6. Evidence for depression or mood disorders  The patients weight, height, BMI and visual acuity have been recorded in the chart I have made referrals, counseling and provided education to the patient based review of the above and I have provided the pt with a written personalized care plan for preventive services.  I have provided you with a copy of your personalized plan for preventive services. Please take the time to review along with your updated medication list.  Will update pneumovax and give flu vaccine He prefers no cancer screening Discussed fitness

## 2017-03-30 NOTE — Assessment & Plan Note (Signed)
Mild anxiety Does okay without Rx

## 2017-04-15 ENCOUNTER — Other Ambulatory Visit: Payer: Self-pay | Admitting: Internal Medicine

## 2017-04-16 ENCOUNTER — Other Ambulatory Visit: Payer: Self-pay

## 2017-04-16 MED ORDER — HYDROCORTISONE 2.5 % EX CREA: TOPICAL_CREAM | Freq: Two times a day (BID) | CUTANEOUS | 1 refills | Status: DC | PRN

## 2017-04-16 NOTE — Telephone Encounter (Signed)
Pt left v/m; pt said for few weeks now refill request x 3 from [facility] has been trying to get refills on folic acid and hydrocortisone cream. I see where folic acid was approved today. Last annual 04/18/17 and last refilled hydrocortisone cream 30 g x11 on 04/23/2014. Dr Silvio Pate is out of office and not on computer until 04/27/17.Please advise.pt request cb.

## 2017-04-16 NOTE — Telephone Encounter (Signed)
Spoke with pt relayed message per Dr. Darnell Level.

## 2017-04-16 NOTE — Telephone Encounter (Signed)
Will refill today, future refills will go through PCP.

## 2017-10-15 ENCOUNTER — Ambulatory Visit: Payer: Self-pay | Admitting: *Deleted

## 2017-10-15 NOTE — Telephone Encounter (Signed)
Pt called stating that he feels like his heart is skipping a beat and has a fluttering feeling and can go on for hours; he states that this has been going on for 3 weeks; he also says that he has had this in the past but it has never lasted this long, nurse triage initiated and recommendations made per protocol to include seeing a physician within 4 hours; the pt states that if he worsens he will go to ED; he also asks for appointment with Dr Silvio Pate on 10/18/17; pt offered and accepted appointment with Dr Silvio Pate 10/18/17 at 1115; pt verbalizes understanding; wioll route to office for notification of this upcoming appointment.    Reason for Disposition . Age > 60 years (Exception: brief heart beat symptoms that went away and now feels well)  Answer Assessment - Initial Assessment Questions 1. DESCRIPTION: "Please describe your heart rate or heart beat that you are having" (e.g., fast/slow, regular/irregular, skipped or extra beats, "palpitations")     Skipped beat, fluttering 2. ONSET: "When did it start?" (Minutes, hours or days)      3 weeks ago 3. DURATION: "How long does it last" (e.g., seconds, minutes, hours)     Minutes to hours 4. PATTERN "Does it come and go, or has it been constant since it started?"  "Does it get worse with exertion?"   "Are you feeling it now?"     Intermittent, not worse with exertion 5. TAP: "Using your hand, can you tap out what you are feeling on a chair or table in front of you, so that I can hear?" (Note: not all patients can do this)       n/a 6. HEART RATE: "Can you tell me your heart rate?" "How many beats in 15 seconds?"  (Note: not all patients can do this)       18 beats in 15 seconds; skipped twice 7. RECURRENT SYMPTOM: "Have you ever had this before?" If so, ask: "When was the last time?" and "What happened that time?"      Yes did not last as long as this episode 8. CAUSE: "What do you think is causing the palpitations?"     Not sure 9. CARDIAC HISTORY:  "Do you have any history of heart disease?" (e.g., heart attack, angina, bypass surgery, angioplasty, arrhythmia)      no 10. OTHER SYMPTOMS: "Do you have any other symptoms?" (e.g., dizziness, chest pain, sweating, difficulty breathing)       no 11. PREGNANCY: "Is there any chance you are pregnant?" "When was your last menstrual period?"       n/a  Protocols used: HEART RATE AND HEARTBEAT QUESTIONS-A-AH

## 2017-10-18 ENCOUNTER — Encounter: Payer: Self-pay | Admitting: Internal Medicine

## 2017-10-18 ENCOUNTER — Ambulatory Visit (INDEPENDENT_AMBULATORY_CARE_PROVIDER_SITE_OTHER): Payer: Medicare Other | Admitting: Internal Medicine

## 2017-10-18 VITALS — BP 144/76 | HR 85 | Temp 98.0°F | Ht 70.0 in | Wt 267.0 lb

## 2017-10-18 DIAGNOSIS — I499 Cardiac arrhythmia, unspecified: Secondary | ICD-10-CM | POA: Diagnosis not present

## 2017-10-18 DIAGNOSIS — R002 Palpitations: Secondary | ICD-10-CM | POA: Diagnosis not present

## 2017-10-18 DIAGNOSIS — F1021 Alcohol dependence, in remission: Secondary | ICD-10-CM

## 2017-10-18 DIAGNOSIS — F39 Unspecified mood [affective] disorder: Secondary | ICD-10-CM

## 2017-10-18 LAB — COMPREHENSIVE METABOLIC PANEL
ALT: 17 U/L (ref 0–53)
AST: 19 U/L (ref 0–37)
Albumin: 4.6 g/dL (ref 3.5–5.2)
Alkaline Phosphatase: 54 U/L (ref 39–117)
BUN: 17 mg/dL (ref 6–23)
CHLORIDE: 101 meq/L (ref 96–112)
CO2: 25 meq/L (ref 19–32)
Calcium: 10 mg/dL (ref 8.4–10.5)
Creatinine, Ser: 0.99 mg/dL (ref 0.40–1.50)
GFR: 80.11 mL/min (ref >60.00)
Glucose, Bld: 120 mg/dL — ABNORMAL HIGH (ref 70–99)
POTASSIUM: 4.8 meq/L (ref 3.5–5.1)
SODIUM: 137 meq/L (ref 135–145)
Total Bilirubin: 0.4 mg/dL (ref 0.2–1.2)
Total Protein: 8 g/dL (ref 6.0–8.3)

## 2017-10-18 LAB — CBC
HEMATOCRIT: 47.5 % (ref 39.0–52.0)
HEMOGLOBIN: 16.1 g/dL (ref 13.0–17.0)
MCHC: 33.8 g/dL (ref 30.0–36.0)
MCV: 91.7 fl (ref 78.0–100.0)
PLATELETS: 261 10*3/uL (ref 150.0–400.0)
RBC: 5.17 Mil/uL (ref 4.22–5.81)
RDW: 13 % (ref 11.5–15.5)
WBC: 7.9 10*3/uL (ref 4.0–10.5)

## 2017-10-18 LAB — T4, FREE: FREE T4: 0.84 ng/dL (ref 0.60–1.60)

## 2017-10-18 NOTE — Assessment & Plan Note (Signed)
Seems to have isolated extra beats--nothing to suggest true arrhythmia EKG shows sinus at 83. Borderline limb lead criteria for LVH--but normal axis, intervals and no ischemic changes. No change since 06/20/13  Discussed---likely nothing concerning Will check labs Decrease caffeine in sodas Discussed beta blocker-----we both feel we should hold off Consider cardiology evaluation/echocardiogam if worsens

## 2017-10-18 NOTE — Assessment & Plan Note (Signed)
No recurrence Due to past problems, will plan to check echo if persistent palpitations, to be sure no cardiomyopathy

## 2017-10-18 NOTE — Telephone Encounter (Signed)
Will evaluate today at the appt

## 2017-10-18 NOTE — Progress Notes (Signed)
Subjective:    Patient ID: Caleb Arellano, male    DOB: July 07, 1950, 67 y.o.   MRN: [id number]  HPI Here due to an irregular heart beat  Has had a feeling of his heart missing a beat in the past Now is more evident---like a skip every 4th or 5th beat No worse with activity Doesn't notice it in bed or with exertion  No SOB No dizziness or syncope---but it makes him nervous  Does have a lot of stress Deaths in the family, wife had to go to hospital, insurance hassles No caffeine other than diet Cheerwine   Abstinent from alcohol No increased abdominal swelling, etc  Current Outpatient Medications on File Prior to Visit  Medication Sig Dispense Refill  . folic acid (FOLVITE) 1 MG tablet TAKE 1 TABLET (1 MG TOTAL) BY MOUTH DAILY. 30 tablet 11  . hydrocortisone 2.5 % cream Apply topically 2 (two) times daily as needed. 30 g 1  . ketoconazole (NIZORAL) 2 % shampoo Apply 1 application topically 2 (two) times a week. 120 mL 11  . meclizine (ANTIVERT) 25 MG tablet Take 1 tablet (25 mg total) by mouth 3 (three) times daily as needed for dizziness. 90 tablet 11  . spironolactone (ALDACTONE) 25 MG tablet Take 2 tablets (50 mg total) by mouth every other day. Or as directed 60 tablet 11  . thiamine (VITAMIN B-1) 100 MG tablet Take 100 mg by mouth daily.     No current facility-administered medications on file prior to visit.     No Known Allergies  Past Medical History:  Diagnosis Date  . 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 Cirrhosis of liver without mention of alcohol   . Gout, unspecified   . Hypertension   . Pure hyperglyceridemia   . Seborrheic dermatitis   . Unspecified hereditary and idiopathic peripheral neuropathy     History reviewed. No pertinent surgical history.  Family History  Problem Relation Age of Onset  . Hypertension Mother   . Diabetes Mother   . Cancer Sister        1 sister with blood cancer, 1 with breast cancer  . Heart disease  Neg Hx     Social History   Socioeconomic History  . Marital status: Married    Spouse name: Not on file  . Number of children: 3  . Years of education: Not on file  . Highest education level: Not on file  Occupational History  . Occupation: disabled  Social Needs  . Financial resource strain: Not on file  . Food insecurity:    Worry: Not on file    Inability: Not on file  . Transportation needs:    Medical: Not on file    Non-medical: Not on file  Tobacco Use  . Smoking status: Former Smoker    Types: Cigarettes  . Smokeless tobacco: Never Used  Substance and Sexual Activity  . Alcohol use: No    Comment: alcoholic, stopped recently  . Drug use: No  . Sexual activity: Not on file  Lifestyle  . Physical activity:    Days per week: Not on file    Minutes per session: Not on file  . Stress: Not on file  Relationships  . Social connections:    Talks on phone: Not on file    Gets together: Not on file    Attends religious service: Not on file    Active member of club or organization: Not on file  Attends meetings of clubs or organizations: Not on file    Relationship status: Not on file  . Intimate partner violence:    Fear of current or ex partner: Not on file    Emotionally abused: Not on file    Physically abused: Not on file    Forced sexual activity: Not on file  Other Topics Concern  . 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 No distress.  Neck: No  thyromegaly present.  Cardiovascular: Normal rate, regular rhythm, normal heart sounds and intact distal pulses. Exam reveals no gallop.  No murmur heard. Rare extra beats and one 3 beat bigeminy  Pulmonary/Chest: Effort normal and breath sounds normal. No respiratory distress. He has no wheezes. He has no rales.  Abdominal: Soft. There is no tenderness.  Musculoskeletal: He exhibits no edema.  Lymphadenopathy:    He has no cervical adenopathy.  Psychiatric: He has a normal mood and affect. His behavior is normal.          Assessment & Plan:

## 2017-10-18 NOTE — Assessment & Plan Note (Signed)
Some stress but nothing that would explain his symptoms

## 2017-10-18 NOTE — Telephone Encounter (Signed)
Pt has appt with Dr Silvio Pate 10/18/17 at 11:15.

## 2018-01-03 DIAGNOSIS — H524 Presbyopia: Secondary | ICD-10-CM | POA: Diagnosis not present

## 2018-01-03 DIAGNOSIS — H5203 Hypermetropia, bilateral: Secondary | ICD-10-CM | POA: Diagnosis not present

## 2018-01-03 DIAGNOSIS — H0101B Ulcerative blepharitis left eye, upper and lower eyelids: Secondary | ICD-10-CM | POA: Diagnosis not present

## 2018-01-03 DIAGNOSIS — H353131 Nonexudative age-related macular degeneration, bilateral, early dry stage: Secondary | ICD-10-CM | POA: Diagnosis not present

## 2018-01-03 DIAGNOSIS — H52223 Regular astigmatism, bilateral: Secondary | ICD-10-CM | POA: Diagnosis not present

## 2018-01-03 DIAGNOSIS — H25011 Cortical age-related cataract, right eye: Secondary | ICD-10-CM | POA: Diagnosis not present

## 2018-01-03 DIAGNOSIS — H0101A Ulcerative blepharitis right eye, upper and lower eyelids: Secondary | ICD-10-CM | POA: Diagnosis not present

## 2018-01-03 DIAGNOSIS — H2513 Age-related nuclear cataract, bilateral: Secondary | ICD-10-CM | POA: Diagnosis not present

## 2018-04-04 ENCOUNTER — Encounter: Payer: Medicare Other | Admitting: Internal Medicine

## 2018-05-27 DIAGNOSIS — Z23 Encounter for immunization: Secondary | ICD-10-CM | POA: Diagnosis not present

## 2018-07-13 ENCOUNTER — Encounter: Payer: Self-pay | Admitting: Internal Medicine

## 2018-07-13 ENCOUNTER — Ambulatory Visit (INDEPENDENT_AMBULATORY_CARE_PROVIDER_SITE_OTHER): Payer: Medicare Other | Admitting: Internal Medicine

## 2018-07-13 VITALS — BP 170/90 | HR 89 | Temp 97.6°F | Ht 69.0 in | Wt 281.0 lb

## 2018-07-13 DIAGNOSIS — F39 Unspecified mood [affective] disorder: Secondary | ICD-10-CM

## 2018-07-13 DIAGNOSIS — R002 Palpitations: Secondary | ICD-10-CM

## 2018-07-13 DIAGNOSIS — K7030 Alcoholic cirrhosis of liver without ascites: Secondary | ICD-10-CM | POA: Diagnosis not present

## 2018-07-13 DIAGNOSIS — Z0000 Encounter for general adult medical examination without abnormal findings: Secondary | ICD-10-CM | POA: Diagnosis not present

## 2018-07-13 DIAGNOSIS — G629 Polyneuropathy, unspecified: Secondary | ICD-10-CM

## 2018-07-13 DIAGNOSIS — F1021 Alcohol dependence, in remission: Secondary | ICD-10-CM

## 2018-07-13 DIAGNOSIS — I10 Essential (primary) hypertension: Secondary | ICD-10-CM

## 2018-07-13 LAB — COMPREHENSIVE METABOLIC PANEL
ALT: 21 U/L (ref 0–53)
AST: 23 U/L (ref 0–37)
Albumin: 4.5 g/dL (ref 3.5–5.2)
Alkaline Phosphatase: 50 U/L (ref 39–117)
BUN: 15 mg/dL (ref 6–23)
CO2: 27 mEq/L (ref 19–32)
Calcium: 9.6 mg/dL (ref 8.4–10.5)
Chloride: 102 mEq/L (ref 96–112)
Creatinine, Ser: 0.96 mg/dL (ref 0.40–1.50)
GFR: 82.83 mL/min (ref >60.00)
Glucose, Bld: 101 mg/dL — ABNORMAL HIGH (ref 70–99)
Potassium: 3.9 mEq/L (ref 3.5–5.1)
SODIUM: 138 meq/L (ref 135–145)
Total Bilirubin: 0.4 mg/dL (ref 0.2–1.2)
Total Protein: 7.9 g/dL (ref 6.0–8.3)

## 2018-07-13 LAB — CBC
HCT: 46.9 % (ref 39.0–52.0)
Hemoglobin: 15.8 g/dL (ref 13.0–17.0)
MCHC: 33.8 g/dL (ref 30.0–36.0)
MCV: 91.6 fl (ref 78.0–100.0)
Platelets: 261 10*3/uL (ref 150.0–400.0)
RBC: 5.12 Mil/uL (ref 4.22–5.81)
RDW: 13.1 % (ref 11.5–15.5)
WBC: 7 10*3/uL (ref 4.0–10.5)

## 2018-07-13 MED ORDER — NEOMYCIN-POLYMYXIN-HC 3.5-10000-1 OT SUSP: 3.0000 [drp] | Freq: Three times a day (TID) | OTIC | 2 refills | Status: DC | PRN

## 2018-07-13 MED ORDER — SPIRONOLACTONE 50 MG PO TABS: 50.0000 mg | ORAL_TABLET | Freq: Every day | ORAL | 3 refills | Status: DC

## 2018-07-13 MED ORDER — FOLIC ACID 1 MG PO TABS: 1.0000 mg | ORAL_TABLET | Freq: Every day | ORAL | 3 refills | Status: DC

## 2018-07-13 NOTE — Progress Notes (Signed)
Subjective:    Patient ID: Caleb Arellano, male    DOB: 14-Jun-1951, 68 y.o.   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 folic acid (FOLVITE) 1 MG tablet TAKE 1 TABLET (1 MG TOTAL) BY MOUTH DAILY. 30 tablet 11  . hydrocortisone 2.5 % cream Apply topically 2 (two) times daily as needed. 30 g 1  . ketoconazole (NIZORAL) 2 % shampoo Apply 1 application topically 2 (two) times a week. 120 mL 11  . meclizine (ANTIVERT) 25 MG tablet Take 1 tablet (25 mg total) by mouth 3 (three) times daily as needed for dizziness. 90 tablet 11  . spironolactone (ALDACTONE) 25 MG tablet Take 2 tablets (50 mg total) by mouth every other day. Or as directed 60 tablet 11  . thiamine (VITAMIN B-1) 100 MG tablet Take 100 mg by mouth daily.     No current facility-administered medications on file prior to visit.     No Known Allergies  Past Medical History:  Diagnosis Date  . 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 Cirrhosis of liver without mention of alcohol   . Gout, unspecified   . Hypertension   . Pure hyperglyceridemia   . Seborrheic dermatitis   . Unspecified hereditary and idiopathic peripheral neuropathy     History reviewed. No pertinent surgical history.  Family History  Problem Relation Age of Onset  . Hypertension Mother   . Diabetes Mother   . Cancer Sister        1 sister with blood cancer, 1 with breast cancer  . Stroke Brother   . Heart disease Neg Hx     Social History    Socioeconomic History  . Marital status: Married    Spouse name: Not on file  . Number of children: 3  . Years of education: Not on file  . Highest education level: Not on file  Occupational History  . Occupation: disabled  Social Needs  . Financial resource strain: Not on file  . Food insecurity:    Worry: Not on file    Inability: Not on file  . Transportation needs:    Medical: Not on file    Non-medical: Not on file  Tobacco Use  . Smoking status: Former Smoker    Types: Cigarettes  . Smokeless tobacco: Never Used  Substance and Sexual Activity  . Alcohol use: No    Comment: alcoholic, stopped recently  . Drug use: No  . Sexual activity: Not on file  Lifestyle  . Physical activity:    Days per week: Not on file    Minutes per session: Not on file  . Stress: Not on file  Relationships  . Social connections:    Talks on phone: Not on file    Gets together: Not on file    Attends religious service: Not on file    Active member of club or organization: Not on file    Attends meetings of clubs or organizations: Not on file  Relationship status: Not on file  . Intimate partner violence:    Fear of current or ex partner: Not on file    Emotionally abused: Not on file    Physically abused: Not on file    Forced sexual activity: Not on file  Other Topics Concern  . Not on file  Social History Narrative   No living will   Wife, then daughter [name], would be medical decision maker   Would accept resuscitation   No prolonged tube feeds if cognitively unaware   Review of Systems  Constitutional: Positive for fatigue and unexpected weight change.       Wears seat belt  HENT: Positive for hearing loss and tinnitus. Negative for trouble swallowing.        Does see dentist--teeth not great  Eyes: Negative for visual disturbance.       No diplopia or unilateral vision loss   Respiratory: Positive for cough and shortness of breath.        Strained right chest with  cough--improved now  Cardiovascular: Positive for palpitations and leg swelling. Negative for chest pain.  Gastrointestinal: Negative for abdominal pain, blood in stool and constipation.       No heartburn  Endocrine: Negative for polydipsia and polyuria.  Genitourinary: Positive for frequency. Negative for difficulty urinating.       Slow stream in AM Rare nocturia No sig ED  Musculoskeletal: Positive for back pain. Negative for arthralgias and joint swelling.       Uses ibuprofen occasionally  Skin: Negative for rash.       Mole increasing in size on right flank  Allergic/Immunologic: Negative for environmental allergies and immunocompromised state.  Neurological: Positive for dizziness. Negative for syncope and headaches.       Still some vertigo type or balance symptoms  Ongoing neuropathy  Hematological: Negative for adenopathy. Does not bruise/bleed easily.  Psychiatric/Behavioral: Negative for dysphoric mood and sleep disturbance.       Still with episodic anxiety        Objective:   Physical Exam  Constitutional: He is oriented to person, place, and time. He appears well-developed. No distress.  HENT:  Head: Normocephalic and atraumatic.  Right Ear: External ear normal.  Left Ear: External ear normal.  Mouth/Throat: Oropharynx is clear and moist. No oropharyngeal exudate.  Mild cerumenosis No inflammation  Eyes: Pupils are equal, round, and reactive to light. Conjunctivae are normal.  Neck: No thyromegaly present.  Cardiovascular: Normal rate, regular rhythm, normal heart sounds and intact distal pulses. Exam reveals no gallop.  No murmur heard. Respiratory: Effort normal and breath sounds normal. No respiratory distress. He has no wheezes. He has no rales.  GI: Soft. He exhibits distension. There is no abdominal tenderness.  Not clear but may have ascites  Musculoskeletal:     Comments: Trace to 1+ edema  Lymphadenopathy:    He has no cervical adenopathy.    Neurological: He is alert and oriented to person, place, and time.  Skin: No rash noted.  seb keratosis on right flank  Psychiatric: He has a normal mood and affect. His behavior is normal.           Assessment & Plan:

## 2018-07-13 NOTE — Assessment & Plan Note (Signed)
BP Readings from Last 3 Encounters:  07/13/18 (!) 170/90  10/18/17 (!) 144/76  03/30/17 136/64   Should be better back on spironolactone

## 2018-07-13 NOTE — Assessment & Plan Note (Signed)
Limits his walking--discussed walking stick

## 2018-07-13 NOTE — Assessment & Plan Note (Addendum)
No clear symptoms Mild edema and may have ascites Restart spironolactone Doesn't want screening

## 2018-07-13 NOTE — Assessment & Plan Note (Signed)
Remains abstinent 

## 2018-07-13 NOTE — Assessment & Plan Note (Signed)
Really bothers him Discussed restarting exercise---and it should be better when heart is fast Check labs

## 2018-07-13 NOTE — Assessment & Plan Note (Signed)
Doing okay but has let himself go Yearly flu vaccine Will consider shingrix Doesn't want any cancer screening

## 2018-07-13 NOTE — Assessment & Plan Note (Signed)
Anxiety seems worse Will try more activity--no meds for now

## 2018-08-16 ENCOUNTER — Ambulatory Visit (INDEPENDENT_AMBULATORY_CARE_PROVIDER_SITE_OTHER): Payer: 59 | Admitting: Internal Medicine

## 2018-08-16 ENCOUNTER — Encounter: Payer: Self-pay | Admitting: Internal Medicine

## 2018-08-16 VITALS — BP 140/78 | HR 78 | Temp 98.0°F | Ht 69.0 in | Wt 277.0 lb

## 2018-08-16 DIAGNOSIS — K7030 Alcoholic cirrhosis of liver without ascites: Secondary | ICD-10-CM

## 2018-08-16 NOTE — Progress Notes (Signed)
Subjective:    Patient ID: Caleb Arellano, male    DOB: 30-Jul-1950, 68 y.o.   MRN: [id number]  HPI Here for follow up of cirrhosis and weight gain  Hasn't changed his diet but did restart the spironolactone Has lost 4#  Still gets occasional palpitations No chest pain Breathing is okay Hasn't really started his exercise  Current Outpatient Medications on File Prior to Visit  Medication Sig Dispense Refill  . folic acid (FOLVITE) 1 MG tablet Take 1 tablet (1 mg total) by mouth daily. 90 tablet 3  . hydrocortisone 2.5 % cream Apply topically 2 (two) times daily as needed. 30 g 1  . ketoconazole (NIZORAL) 2 % shampoo Apply 1 application topically 2 (two) times a week. 120 mL 11  . meclizine (ANTIVERT) 25 MG tablet Take 1 tablet (25 mg total) by mouth 3 (three) times daily as needed for dizziness. 90 tablet 11  . neomycin-polymyxin-hydrocortisone (CORTISPORIN) 3.5-10000-1 OTIC suspension Place 3 drops into the right ear 3 (three) times daily as needed. 10 mL 2  . spironolactone (ALDACTONE) 50 MG tablet Take 1 tablet (50 mg total) by mouth daily. 90 tablet 3  . thiamine (VITAMIN B-1) 100 MG tablet Take 100 mg by mouth daily.     No current facility-administered medications on file prior to visit.     No Known Allergies  Past Medical History:  Diagnosis Date  . 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 Cirrhosis of liver without mention of alcohol   . Gout, unspecified   . Hypertension   . Pure hyperglyceridemia   . Seborrheic dermatitis   . Unspecified hereditary and idiopathic peripheral neuropathy     History reviewed. No pertinent surgical history.  Family History  Problem Relation Age of Onset  . Hypertension Mother   . Diabetes Mother   . Cancer Sister        1 sister with blood cancer, 1 with breast cancer  . Stroke Brother   . Heart disease Neg Hx     Social History   Socioeconomic History  . Marital status: Married    Spouse name: Not on  file  . Number of children: 3  . Years of education: Not on file  . Highest education level: Not on file  Occupational History  . Occupation: disabled  Social Needs  . Financial resource strain: Not on file  . Food insecurity:    Worry: Not on file    Inability: Not on file  . Transportation needs:    Medical: Not on file    Non-medical: Not on file  Tobacco Use  . Smoking status: Former Smoker    Types: Cigarettes  . Smokeless tobacco: Never Used  Substance and Sexual Activity  . Alcohol use: No    Comment: alcoholic, stopped recently  . Drug use: No  . Sexual activity: Not on file  Lifestyle  . Physical activity:    Days per week: Not on file    Minutes per session: Not on file  . Stress: Not on file  Relationships  . Social connections:    Talks on phone: Not on file    Gets together: Not on file    Attends religious service: Not on file    Active member of club or organization: Not on file    Attends meetings of clubs or organizations: Not on file    Relationship status: Not on file  . Intimate partner violence:    Fear  of current or ex partner: Not on file    Emotionally abused: Not on file    Physically abused: Not on file    Forced sexual activity: Not on file  Other Topics Concern  . Not on file  Social History Narrative   No living will   Wife, then daughter [name], would be medical decision maker   Would accept resuscitation   No prolonged tube feeds if cognitively unaware   Review of Systems  Sleeps okay No sig edema     Objective:   Physical Exam  Constitutional: He appears well-developed. No distress.  Neck: No thyromegaly present.  Cardiovascular: Normal rate, regular rhythm and normal heart sounds. Exam reveals no gallop.  No murmur heard. Respiratory: Effort normal and breath sounds normal. No respiratory distress. He has no wheezes. He has no rales.  No dullness  GI: Soft. There is no abdominal tenderness.  Big belly seems like fat--not  fluid  Musculoskeletal:        General: No edema.  Lymphadenopathy:    He has no cervical adenopathy.  Psychiatric: He has a normal mood and affect. His behavior is normal.           Assessment & Plan:

## 2018-08-16 NOTE — Assessment & Plan Note (Signed)
Has lost a few pounds back on the spironolactone Mostly needs to get back to his healthier eating and restart exercise

## 2018-12-15 DIAGNOSIS — H16042 Marginal corneal ulcer, left eye: Secondary | ICD-10-CM | POA: Diagnosis not present

## 2019-01-09 DIAGNOSIS — S0502XA Injury of conjunctiva and corneal abrasion without foreign body, left eye, initial encounter: Secondary | ICD-10-CM | POA: Diagnosis not present

## 2019-01-27 ENCOUNTER — Other Ambulatory Visit: Payer: Self-pay

## 2019-04-19 DIAGNOSIS — Z23 Encounter for immunization: Secondary | ICD-10-CM | POA: Diagnosis not present

## 2019-05-02 DIAGNOSIS — H0102B Squamous blepharitis left eye, upper and lower eyelids: Secondary | ICD-10-CM | POA: Diagnosis not present

## 2019-05-02 DIAGNOSIS — H524 Presbyopia: Secondary | ICD-10-CM | POA: Diagnosis not present

## 2019-05-02 DIAGNOSIS — H0102A Squamous blepharitis right eye, upper and lower eyelids: Secondary | ICD-10-CM | POA: Diagnosis not present

## 2019-05-02 DIAGNOSIS — H2513 Age-related nuclear cataract, bilateral: Secondary | ICD-10-CM | POA: Diagnosis not present

## 2019-05-02 DIAGNOSIS — H25013 Cortical age-related cataract, bilateral: Secondary | ICD-10-CM | POA: Diagnosis not present

## 2019-05-02 DIAGNOSIS — H353131 Nonexudative age-related macular degeneration, bilateral, early dry stage: Secondary | ICD-10-CM | POA: Diagnosis not present

## 2019-07-17 ENCOUNTER — Encounter: Payer: Medicare Other | Admitting: Internal Medicine

## 2019-08-18 ENCOUNTER — Encounter: Payer: Medicare Other | Admitting: Internal Medicine

## 2019-08-24 ENCOUNTER — Ambulatory Visit: Payer: Medicare HMO | Attending: Internal Medicine

## 2019-08-24 DIAGNOSIS — Z23 Encounter for immunization: Secondary | ICD-10-CM | POA: Insufficient documentation

## 2019-08-24 NOTE — Progress Notes (Signed)
   Covid-19 Vaccination Clinic  Name:  Caleb Arellano    MRN: [id number] DOB: [birth year]  08/24/2019  Caleb Arellano was observed post Covid-19 immunization for 15 minutes without incidence. He was provided with Vaccine Information Sheet and instruction to access the V-Safe system.   Caleb Arellano was instructed to call 911 with any severe reactions post vaccine: Marland Kitchen Difficulty breathing  . Swelling of your face and throat  . A fast heartbeat  . A bad rash all over your body  . Dizziness and weakness    Immunizations Administered    Name Date Dose VIS Date Route   Pfizer COVID-19 Vaccine 08/24/2019 12:35 PM 0.3 mL 06/09/2019 Intramuscular   Manufacturer: [place]   Lot: [id number]   [place]: [id number]

## 2019-09-06 ENCOUNTER — Other Ambulatory Visit: Payer: Self-pay | Admitting: Internal Medicine

## 2019-09-12 ENCOUNTER — Encounter: Payer: Medicare Other | Admitting: Internal Medicine

## 2019-09-15 ENCOUNTER — Other Ambulatory Visit: Payer: Self-pay | Admitting: Internal Medicine

## 2019-09-20 ENCOUNTER — Ambulatory Visit: Payer: Medicare HMO | Attending: Internal Medicine

## 2019-09-20 DIAGNOSIS — Z23 Encounter for immunization: Secondary | ICD-10-CM

## 2019-09-20 NOTE — Progress Notes (Signed)
   Covid-19 Vaccination Clinic  Name:  Caleb Arellano    MRN: [id number] DOB: March 14, 1951  09/20/2019  [name] was observed post Covid-19 immunization for 15 minutes without incident. He was provided with Vaccine Information Sheet and instruction to access the V-Safe system.   [name] was instructed to call 911 with any severe reactions post vaccine: Marland Kitchen Difficulty breathing  . Swelling of face and throat  . A fast heartbeat  . A bad rash all over body  . Dizziness and weakness   Immunizations Administered    Name Date Dose VIS Date Route   Pfizer COVID-19 Vaccine 09/20/2019  2:25 PM 0.3 mL 06/09/2019 Intramuscular   Manufacturer: [place]   Lot: [id number]   [place]: [id number]

## 2019-10-09 ENCOUNTER — Encounter: Payer: Self-pay | Admitting: Internal Medicine

## 2019-10-09 ENCOUNTER — Other Ambulatory Visit: Payer: Self-pay

## 2019-10-09 ENCOUNTER — Ambulatory Visit (INDEPENDENT_AMBULATORY_CARE_PROVIDER_SITE_OTHER): Payer: Medicare HMO | Admitting: Internal Medicine

## 2019-10-09 VITALS — BP 136/84 | HR 79 | Temp 98.4°F | Ht 68.75 in | Wt 282.0 lb

## 2019-10-09 DIAGNOSIS — G621 Alcoholic polyneuropathy: Secondary | ICD-10-CM | POA: Diagnosis not present

## 2019-10-09 DIAGNOSIS — Z7189 Other specified counseling: Secondary | ICD-10-CM | POA: Diagnosis not present

## 2019-10-09 DIAGNOSIS — F1021 Alcohol dependence, in remission: Secondary | ICD-10-CM | POA: Diagnosis not present

## 2019-10-09 DIAGNOSIS — K7030 Alcoholic cirrhosis of liver without ascites: Secondary | ICD-10-CM

## 2019-10-09 DIAGNOSIS — Z0000 Encounter for general adult medical examination without abnormal findings: Secondary | ICD-10-CM

## 2019-10-09 DIAGNOSIS — R002 Palpitations: Secondary | ICD-10-CM

## 2019-10-09 DIAGNOSIS — I10 Essential (primary) hypertension: Secondary | ICD-10-CM | POA: Diagnosis not present

## 2019-10-09 LAB — RENAL FUNCTION PANEL
Albumin: 4.4 g/dL (ref 3.5–5.2)
BUN: 14 mg/dL (ref 6–23)
CO2: 26 mEq/L (ref 19–32)
Calcium: 9.6 mg/dL (ref 8.4–10.5)
Chloride: 104 mEq/L (ref 96–112)
Creatinine, Ser: 0.93 mg/dL (ref 0.40–1.50)
GFR: 80.54 mL/min (ref >60.00)
Glucose, Bld: 109 mg/dL — ABNORMAL HIGH (ref 70–99)
Phosphorus: 2.9 mg/dL (ref 2.3–4.6)
Potassium: 4.4 mEq/L (ref 3.5–5.1)
Sodium: 138 mEq/L (ref 135–145)

## 2019-10-09 LAB — CBC
HCT: 44.8 % (ref 39.0–52.0)
Hemoglobin: 15.1 g/dL (ref 13.0–17.0)
MCHC: 33.8 g/dL (ref 30.0–36.0)
MCV: 92.5 fl (ref 78.0–100.0)
Platelets: 231 10*3/uL (ref 150.0–400.0)
RBC: 4.85 Mil/uL (ref 4.22–5.81)
RDW: 13.3 % (ref 11.5–15.5)
WBC: 7.1 10*3/uL (ref 4.0–10.5)

## 2019-10-09 LAB — HEPATIC FUNCTION PANEL
ALT: 19 U/L (ref 0–53)
AST: 19 U/L (ref 0–37)
Albumin: 4.4 g/dL (ref 3.5–5.2)
Alkaline Phosphatase: 57 U/L (ref 39–117)
Bilirubin, Direct: 0.1 mg/dL (ref 0.0–0.3)
Total Bilirubin: 0.4 mg/dL (ref 0.2–1.2)
Total Protein: 7.3 g/dL (ref 6.0–8.3)

## 2019-10-09 LAB — VITAMIN B12: Vitamin B-12: 241 pg/mL (ref 211–911)

## 2019-10-09 LAB — T4, FREE: Free T4: 0.79 ng/dL (ref 0.60–1.60)

## 2019-10-09 NOTE — Assessment & Plan Note (Signed)
Stable symptoms

## 2019-10-09 NOTE — Assessment & Plan Note (Signed)
Remains abstinent 

## 2019-10-09 NOTE — Assessment & Plan Note (Signed)
BP Readings from Last 3 Encounters:  10/09/19 136/84  08/16/18 140/78  07/13/18 (!) 170/90   Good control Due for labs Only the aldactone

## 2019-10-09 NOTE — Assessment & Plan Note (Signed)
Nothing to suggest decompensation On the spironolactone Discussed screening ultrasound--he prefers not

## 2019-10-09 NOTE — Assessment & Plan Note (Signed)
I have personally reviewed the Medicare Annual Wellness questionnaire and have noted 1. The patient's medical and social history 2. Their use of alcohol, tobacco or illicit drugs 3. Their current medications and supplements 4. The patient's functional ability including ADL's, fall risks, home safety risks and hearing or visual             impairment. 5. Diet and physical activities 6. Evidence for depression or mood disorders  The patients weight, height, BMI and visual acuity have been recorded in the chart I have made referrals, counseling and provided education to the patient based review of the above and I have provided the pt with a written personalized care plan for preventive services. He fa I have provided you with a copy of your personalized plan for preventive services. Please take the time to review along with your updated medication list.  Still prefers no cancer screening Had COVID vaccine Flu vaccine in the fall Discussed fitness

## 2019-10-09 NOTE — Progress Notes (Signed)
Hearing Screening   Method: Audiometry   125Hz  250Hz  500Hz  1000Hz  2000Hz  3000Hz  4000Hz  6000Hz  8000Hz   Right ear:   20 40 0  0    Left ear:   20 20 20   0    Vision Screening Comments: June 2020

## 2019-10-09 NOTE — Progress Notes (Signed)
Subjective:    Patient ID: Caleb Arellano, male    DOB: [birth year], 69 y.o.   MRN: [id number]  HPI Here for initial Medicare wellness visit and follow up of chronic health conditions This visit occurred during the SARS-CoV-2 public health emergency.  Safety protocols were in place, including screening questions prior to the visit, additional usage of staff PPE, and extensive cleaning of exam room while observing appropriate contact time as indicated for disinfecting solutions.   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 Bad tinnitus Has several falls---if he is not careful. No sig injuries Independent with instrumental ADLs--just easy fatigue Mild memory issues---nothing worrisome  He is doing okay Feels his fluid is better though his weight is up No SOB--but pretty sedentary Legs go numb after walking 200 yards---neuropathy No chest pain Still gets regular skipped beats---could be for days at a time than resolve  No alcohol still  Current Outpatient Medications on File Prior to Visit  Medication Sig Dispense Refill  . folic acid (FOLVITE) 1 MG tablet TAKE 1 TABLET BY MOUTH EVERY DAY 90 tablet 0  . hydrocortisone 2.5 % cream Apply topically 2 (two) times daily as needed. 30 g 1  . ketoconazole (NIZORAL) 2 % shampoo Apply 1 application topically 2 (two) times a week. 120 mL 11  . meclizine (ANTIVERT) 25 MG tablet Take 1 tablet (25 mg total) by mouth 3 (three) times daily as needed for dizziness. 90 tablet 11  . neomycin-polymyxin-hydrocortisone (CORTISPORIN) 3.5-10000-1 OTIC suspension Place 3 drops into the right ear 3 (three) times daily as needed. 10 mL 2  . spironolactone (ALDACTONE) 50 MG tablet TAKE 1 TABLET BY MOUTH EVERY DAY 30 tablet 1    . thiamine (VITAMIN B-1) 100 MG tablet Take 100 mg by mouth daily.     No current facility-administered medications on file prior to visit.    No Active Allergies  Past Medical History:  Diagnosis Date  . 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 Cirrhosis of liver without mention of alcohol   . Gout, unspecified   . Hypertension   . Pure hyperglyceridemia   . Seborrheic dermatitis   . Unspecified hereditary and idiopathic peripheral neuropathy     History reviewed. No pertinent surgical history.  Family History  Problem Relation Age of Onset  . Hypertension Mother   . Diabetes Mother   . Cancer Sister        1 sister with blood cancer, 1 with breast cancer  . Stroke Brother   . Heart disease Neg Hx     Social History   Socioeconomic History  . Marital status: Married    Spouse name: Not on file  . Number of children: 3  . Years of education: Not on file  . Highest education level: Not on file  Occupational History  . Occupation: disabled  Tobacco Use  . Smoking status: Former Smoker    Types: Cigarettes  . Smokeless tobacco: Never Used  Substance and Sexual Activity  . Alcohol use: No    Comment: alcoholic, stopped recently  . Drug use: No  . Sexual activity: Not on file  Other Topics Concern  . Not on file  Social History Narrative   No living will   Wife, then daughter Caleb Arellano, would be medical decision maker  Would accept resuscitation   No prolonged tube feeds if cognitively unaware   Social Determinants of Health   Financial Resource Strain:   . Difficulty of Paying Living Expenses:   Food Insecurity:   . Worried About Charity fundraiser in the Last Year:   . Arboriculturist in the Last Year:   Transportation Needs:   . Film/video editor (Medical):   Marland Kitchen Lack of Transportation (Non-Medical):   Physical Activity:   . Days of Exercise per Week:   . Minutes of Exercise per Session:   Stress:   . Feeling of Stress :    Social Connections:   . Frequency of Communication with Friends and Family:   . Frequency of Social Gatherings with Friends and Family:   . Attends Religious Services:   . Active Member of Clubs or Organizations:   . Attends Archivist Meetings:   Marland Kitchen Marital Status:   Intimate Partner Violence:   . Fear of Current or Ex-Partner:   . Emotionally Abused:   Marland Kitchen Physically Abused:   . Sexually Abused:    Review of Systems His weight is up---mostly due to being stuck home for COVID and eating more Appetite is good Sleeps okay Wears seat belt Teeth are poor--now with bottom partial seb dermatitis itchy at times--cream helps. No suspicious lesions No regular heartburn--- no dysphagia. Occasional gas--better with belch Bowels are fine--no blood Voids okay---slow stream. Some back pain--relates to his weight. Neuropathy more on right---with prolonged standing    Objective:   Physical Exam  Constitutional: He is oriented to person, place, and time. He appears well-developed. No distress.  HENT:  Mouth/Throat: Oropharynx is clear and moist.  No oral lesions  Neck: No thyromegaly present.  Cardiovascular: Normal rate, regular rhythm, normal heart sounds and intact distal pulses. Exam reveals no gallop.  No murmur heard. Respiratory: Effort normal and breath sounds normal. No respiratory distress. He has no wheezes. He has no rales.  GI: Soft. There is no abdominal tenderness.  No clear ascites--but abdomen bigger  Musculoskeletal:        General: No tenderness or edema.  Lymphadenopathy:    He has no cervical adenopathy.  Neurological: He is alert and oriented to person, place, and time.  President--- "Caleb Arellano, Caleb Arellano, Caleb Arellano" [phone] something [id number] Recall 3/3  Skin: No rash noted. No erythema.  Psychiatric: He has a normal mood and affect. His behavior is normal.           Assessment & Plan:

## 2019-10-09 NOTE — Assessment & Plan Note (Signed)
See social history Blank forms given again

## 2019-10-09 NOTE — Assessment & Plan Note (Signed)
Episodic skipped beats Regular now

## 2019-10-11 ENCOUNTER — Other Ambulatory Visit: Payer: Self-pay | Admitting: Internal Medicine

## 2019-12-18 DIAGNOSIS — H43812 Vitreous degeneration, left eye: Secondary | ICD-10-CM | POA: Diagnosis not present

## 2020-01-08 DIAGNOSIS — H43812 Vitreous degeneration, left eye: Secondary | ICD-10-CM | POA: Diagnosis not present

## 2020-01-15 ENCOUNTER — Other Ambulatory Visit: Payer: Self-pay

## 2020-01-15 ENCOUNTER — Ambulatory Visit (INDEPENDENT_AMBULATORY_CARE_PROVIDER_SITE_OTHER): Payer: Medicare HMO | Admitting: Family Medicine

## 2020-01-15 VITALS — BP 164/80 | HR 85 | Temp 98.0°F | Wt 282.0 lb

## 2020-01-15 DIAGNOSIS — I10 Essential (primary) hypertension: Secondary | ICD-10-CM

## 2020-01-15 DIAGNOSIS — R42 Dizziness and giddiness: Secondary | ICD-10-CM

## 2020-01-15 DIAGNOSIS — R739 Hyperglycemia, unspecified: Secondary | ICD-10-CM | POA: Diagnosis not present

## 2020-01-15 LAB — COMPREHENSIVE METABOLIC PANEL
ALT: 16 U/L (ref 0–53)
AST: 16 U/L (ref 0–37)
Albumin: 4.2 g/dL (ref 3.5–5.2)
Alkaline Phosphatase: 56 U/L (ref 39–117)
BUN: 7 mg/dL (ref 6–23)
CO2: 26 mEq/L (ref 19–32)
Calcium: 9.2 mg/dL (ref 8.4–10.5)
Chloride: 102 mEq/L (ref 96–112)
Creatinine, Ser: 0.85 mg/dL (ref 0.40–1.50)
GFR: 89.28 mL/min (ref >60.00)
Glucose, Bld: 119 mg/dL — ABNORMAL HIGH (ref 70–99)
Potassium: 4.1 mEq/L (ref 3.5–5.1)
Sodium: 136 mEq/L (ref 135–145)
Total Bilirubin: 0.4 mg/dL (ref 0.2–1.2)
Total Protein: 7.3 g/dL (ref 6.0–8.3)

## 2020-01-15 LAB — HEMOGLOBIN A1C: Hgb A1c MFr Bld: 5.8 % (ref 4.6–6.5)

## 2020-01-15 LAB — CBC
HCT: 43.1 % (ref 39.0–52.0)
Hemoglobin: 14.7 g/dL (ref 13.0–17.0)
MCHC: 34.1 g/dL (ref 30.0–36.0)
MCV: 92.1 fl (ref 78.0–100.0)
Platelets: 221 10*3/uL (ref 150.0–400.0)
RBC: 4.68 Mil/uL (ref 4.22–5.81)
RDW: 13.2 % (ref 11.5–15.5)
WBC: 7.7 10*3/uL (ref 4.0–10.5)

## 2020-01-15 MED ORDER — MECLIZINE HCL 12.5 MG PO TABS: 12.5000 mg | ORAL_TABLET | Freq: Three times a day (TID) | ORAL | 0 refills | Status: DC | PRN

## 2020-01-15 NOTE — Patient Instructions (Addendum)
Meclizine - take 1-2 tablets as needed up to 3 times a day   Physical therapy  Return in 2 weeks for blood pressure check or send a MyChart if you get a monitor and can check at home  -- Take Spironolactone everyday  Call if chest pain, worsening breathing, headache, or weakness

## 2020-01-15 NOTE — Assessment & Plan Note (Signed)
He noted that he drinks >100 oz of carbonated water per day. Wonder about excessive thirst and endorses dry mouth though indicated this was more related to carbonation desire. Will check Hgba1c given mildly elevated cbg on fasting labs in the past.

## 2020-01-15 NOTE — Progress Notes (Signed)
Subjective:     Caleb Arellano is a 69 y.o. male presenting for Dizziness (x couple weeks )     HPI   #Dizziness - off balance sensation - the "world tries to move" - not lightheaded symptoms - has neuropathy which impacts his balance - issues with shifting the eyes - looking up from a book - will also get symptoms with turning the head - also endorses transitions from sitting > standing trigger - but gets symptoms with laying down as well and turning to the side - was prescribed meclizine in the past  #HTN - hydration: carbonated water - thinks he drinks 12 of these per day, feels constantly thirsty with dry mouth - does not check BP at home - feels nervous today with the dizziness - spironolactone not taking regularly - prescribed due to cirrhosis   Review of Systems   Social History   Tobacco Use  Smoking Status Former Smoker  . Types: Cigarettes  Smokeless Tobacco Never Used        Objective:    BP Readings from Last 3 Encounters:  01/15/20 (!) 164/80  10/09/19 136/84  08/16/18 140/78   Wt Readings from Last 3 Encounters:  01/15/20 282 lb (127.9 kg)  10/09/19 282 lb (127.9 kg)  08/16/18 277 lb (125.6 kg)    BP (!) 164/80 (BP Location: Left Arm, Cuff Size: Large)   Pulse 85   Temp 98 F (36.7 C) (Temporal)   Wt 282 lb (127.9 kg)   SpO2 98%   BMI 41.95 kg/m    Physical Exam Constitutional:      Appearance: Normal appearance. He is not ill-appearing or diaphoretic.  HENT:     Head: Normocephalic and atraumatic.     Right Ear: External ear normal. There is impacted cerumen.     Left Ear: External ear normal. There is impacted cerumen.  Eyes:     General: No scleral icterus.    Extraocular Movements: Extraocular movements intact.     Right eye: Normal extraocular motion and no nystagmus.     Left eye: Normal extraocular motion and no nystagmus.     Conjunctiva/sclera: Conjunctivae normal.     Comments: Endorses some dizziness with  leftward gaze > rightward  Cardiovascular:     Rate and Rhythm: Normal rate and regular rhythm.     Heart sounds: No murmur heard.   Pulmonary:     Effort: Pulmonary effort is normal. No respiratory distress.     Breath sounds: Normal breath sounds. No wheezing.  Musculoskeletal:     Cervical back: Neck supple.  Skin:    General: Skin is warm and dry.  Neurological:     Mental Status: He is alert. Mental status is at baseline.  Psychiatric:        Mood and Affect: Mood normal.           Assessment & Plan:   Problem List Items Addressed This Visit      Cardiovascular and Mediastinum   Essential hypertension, benign - Primary    Poorly controlled. Reports he has not consistently been taking Spironolactone 2/2 to dry eyes. Encouraged daily use of this medication and return in 2 weeks for BP check. Labs today given positive orthostatic VS.       Relevant Orders   Comprehensive metabolic panel   CBC     Other   Vertigo    Symptoms seems most consistent with vertigo, however, did have significant BP drop with laying >  sitting so unclear. Discussed option of ENT referral vs PT and pt will do PT referral. Meclizine prescribed to start while waiting referral. If no improvement would recommend ENT referral.       Relevant Medications   meclizine (ANTIVERT) 12.5 MG tablet   Other Relevant Orders   Ambulatory referral to Physical Therapy   Comprehensive metabolic panel   CBC   Hyperglycemia    He noted that he drinks >100 oz of carbonated water per day. Wonder about excessive thirst and endorses dry mouth though indicated this was more related to carbonation desire. Will check Hgba1c given mildly elevated cbg on fasting labs in the past.       Relevant Orders   Hemoglobin A1c       Return in about 2 weeks (around 01/29/2020) for BP check.  Lesleigh Noe, MD  This visit occurred during the SARS-CoV-2 public health emergency.  Safety protocols were in place, including  screening questions prior to the visit, additional usage of staff PPE, and extensive cleaning of exam room while observing appropriate contact time as indicated for disinfecting solutions.

## 2020-01-15 NOTE — Assessment & Plan Note (Signed)
Poorly controlled. Reports he has not consistently been taking Spironolactone 2/2 to dry eyes. Encouraged daily use of this medication and return in 2 weeks for BP check. Labs today given positive orthostatic VS.

## 2020-01-15 NOTE — Assessment & Plan Note (Signed)
Symptoms seems most consistent with vertigo, however, did have significant BP drop with laying > sitting so unclear. Discussed option of ENT referral vs PT and pt will do PT referral. Meclizine prescribed to start while waiting referral. If no improvement would recommend ENT referral.

## 2020-01-30 ENCOUNTER — Encounter: Payer: Self-pay | Admitting: Internal Medicine

## 2020-01-30 ENCOUNTER — Other Ambulatory Visit: Payer: Self-pay

## 2020-01-30 ENCOUNTER — Ambulatory Visit (INDEPENDENT_AMBULATORY_CARE_PROVIDER_SITE_OTHER): Payer: Medicare HMO | Admitting: Internal Medicine

## 2020-01-30 DIAGNOSIS — R42 Dizziness and giddiness: Secondary | ICD-10-CM | POA: Diagnosis not present

## 2020-01-30 DIAGNOSIS — F39 Unspecified mood [affective] disorder: Secondary | ICD-10-CM | POA: Diagnosis not present

## 2020-01-30 DIAGNOSIS — I10 Essential (primary) hypertension: Secondary | ICD-10-CM | POA: Diagnosis not present

## 2020-01-30 MED ORDER — ALPRAZOLAM 0.25 MG PO TABS: 0.2500 mg | ORAL_TABLET | Freq: Two times a day (BID) | ORAL | 0 refills | Status: DC | PRN

## 2020-01-30 NOTE — Assessment & Plan Note (Signed)
More anxious (maybe from the recent symptoms) Even panicky at times Will give a few xanax for prn use

## 2020-01-30 NOTE — Assessment & Plan Note (Signed)
This is better but not gone Can continue the meclizine till completely better

## 2020-01-30 NOTE — Assessment & Plan Note (Signed)
BP Readings from Last 3 Encounters:  01/30/20 (!) 146/86  01/15/20 (!) 164/80  10/09/19 136/84   Better now Discussed avoiding salt Didn't tolerate beta blockers in the past

## 2020-01-30 NOTE — Progress Notes (Signed)
Subjective:    Patient ID: Caleb Arellano, male    DOB: 09-14-1950, 69 y.o.   MRN: [id number]  HPI Here for follow up of vertigo and elevated BP This visit occurred during the SARS-CoV-2 public health emergency.  Safety protocols were in place, including screening questions prior to the visit, additional usage of staff PPE, and extensive cleaning of exam room while observing appropriate contact time as indicated for disinfecting solutions.   Vertigo is better Taking the meclizine daily (first thing in the morning) Head felt funny with movement still but no vertigo--just "heavy head feeling" Feeling actually made him panicky Has noted some claustrophobia lately  Tough year  Lost 2 brothers  Money issues with home repairs   Did get BP cuff---but it is wrist  Has been elevated at home  More regular with the spironolactone now But it does cause dry eyes  Current Outpatient Medications on File Prior to Visit  Medication Sig Dispense Refill  . folic acid (FOLVITE) 1 MG tablet TAKE 1 TABLET BY MOUTH EVERY DAY 90 tablet 0  . hydrocortisone 2.5 % cream Apply topically 2 (two) times daily as needed. 30 g 1  . ketoconazole (NIZORAL) 2 % shampoo Apply 1 application topically 2 (two) times a week. (Patient taking differently: Apply 1 application topically as needed. ) 120 mL 11  . meclizine (ANTIVERT) 12.5 MG tablet Take 1 tablet (12.5 mg total) by mouth 3 (three) times daily as needed for dizziness. 90 tablet 0  . moxifloxacin (VIGAMOX) 0.5 % ophthalmic solution     . neomycin-polymyxin-hydrocortisone (CORTISPORIN) 3.5-10000-1 OTIC suspension Place 3 drops into the right ear 3 (three) times daily as needed. 10 mL 2  . spironolactone (ALDACTONE) 50 MG tablet TAKE 1 TABLET BY MOUTH EVERY DAY 90 tablet 3  . thiamine (VITAMIN B-1) 100 MG tablet Take 100 mg by mouth daily.      No current facility-administered medications on file prior to visit.    No Active Allergies  Past Medical History:    Diagnosis Date  . 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 Cirrhosis of liver without mention of alcohol   . Gout, unspecified   . Hypertension   . Pure hyperglyceridemia   . Seborrheic dermatitis   . Unspecified hereditary and idiopathic peripheral neuropathy     History reviewed. No pertinent surgical history.  Family History  Problem Relation Age of Onset  . Hypertension Mother   . Diabetes Mother   . Cancer Sister        1 sister with blood cancer, 1 with breast cancer  . Stroke Brother   . Heart disease Neg Hx     Social History   Socioeconomic History  . Marital status: Married    Spouse name: Not on file  . Number of children: 3  . Years of education: Not on file  . Highest education level: Not on file  Occupational History  . Occupation: disabled  Tobacco Use  . Smoking status: Former Smoker    Types: Cigarettes  . Smokeless tobacco: Never Used  Substance and Sexual Activity  . Alcohol use: No    Comment: alcoholic, stopped recently  . Drug use: No  . Sexual activity: Not on file  Other Topics Concern  . Not on file  Social History Narrative   No living will   Wife, then daughter [name], would be medical decision maker   Would accept resuscitation   No prolonged tube feeds if  cognitively unaware   Social Determinants of Health   Financial Resource Strain:   . Difficulty of Paying Living Expenses:   Food Insecurity:   . Worried About Charity fundraiser in the Last Year:   . Arboriculturist in the Last Year:   Transportation Needs:   . Film/video editor (Medical):   Marland Kitchen Lack of Transportation (Non-Medical):   Physical Activity:   . Days of Exercise per Week:   . Minutes of Exercise per Session:   Stress:   . Feeling of Stress :   Social Connections:   . Frequency of Communication with Friends and Family:   . Frequency of Social Gatherings with Friends and Family:   . Attends Religious Services:   . Active Member of Clubs  or Organizations:   . Attends Archivist Meetings:   Marland Kitchen Marital Status:   Intimate Partner Violence:   . Fear of Current or Ex-Partner:   . Emotionally Abused:   Marland Kitchen Physically Abused:   . Sexually Abused:    Review of Systems  No chest pain  No SOB--just chronic DOE     Objective:   Physical Exam Constitutional:      Appearance: Normal appearance.  Eyes:     Comments: No nystagmus--but does have symptoms with lateral gaze  Cardiovascular:     Rate and Rhythm: Normal rate and regular rhythm.     Heart sounds: No murmur heard.  No gallop.   Pulmonary:     Effort: Pulmonary effort is normal.     Breath sounds: Normal breath sounds. No wheezing or rales.  Musculoskeletal:     Cervical back: Neck supple.  Lymphadenopathy:     Cervical: No cervical adenopathy.  Neurological:     Mental Status: He is alert.            Assessment & Plan:

## 2020-05-02 DIAGNOSIS — H353131 Nonexudative age-related macular degeneration, bilateral, early dry stage: Secondary | ICD-10-CM | POA: Diagnosis not present

## 2020-05-02 DIAGNOSIS — H25013 Cortical age-related cataract, bilateral: Secondary | ICD-10-CM | POA: Diagnosis not present

## 2020-05-02 DIAGNOSIS — H0102A Squamous blepharitis right eye, upper and lower eyelids: Secondary | ICD-10-CM | POA: Diagnosis not present

## 2020-05-02 DIAGNOSIS — H0102B Squamous blepharitis left eye, upper and lower eyelids: Secondary | ICD-10-CM | POA: Diagnosis not present

## 2020-05-02 DIAGNOSIS — H2513 Age-related nuclear cataract, bilateral: Secondary | ICD-10-CM | POA: Diagnosis not present

## 2020-05-02 DIAGNOSIS — H524 Presbyopia: Secondary | ICD-10-CM | POA: Diagnosis not present

## 2020-09-09 ENCOUNTER — Other Ambulatory Visit: Payer: Self-pay

## 2020-10-06 ENCOUNTER — Other Ambulatory Visit: Payer: Self-pay | Admitting: Internal Medicine

## 2020-10-15 ENCOUNTER — Other Ambulatory Visit: Payer: Self-pay

## 2020-10-15 ENCOUNTER — Ambulatory Visit (INDEPENDENT_AMBULATORY_CARE_PROVIDER_SITE_OTHER): Payer: Medicare HMO | Admitting: Internal Medicine

## 2020-10-15 ENCOUNTER — Encounter: Payer: Self-pay | Admitting: Internal Medicine

## 2020-10-15 VITALS — BP 138/72 | HR 81 | Temp 97.8°F | Ht 68.5 in | Wt 277.0 lb

## 2020-10-15 DIAGNOSIS — F1021 Alcohol dependence, in remission: Secondary | ICD-10-CM | POA: Diagnosis not present

## 2020-10-15 DIAGNOSIS — R7303 Prediabetes: Secondary | ICD-10-CM

## 2020-10-15 DIAGNOSIS — K7030 Alcoholic cirrhosis of liver without ascites: Secondary | ICD-10-CM | POA: Diagnosis not present

## 2020-10-15 DIAGNOSIS — Z7189 Other specified counseling: Secondary | ICD-10-CM | POA: Diagnosis not present

## 2020-10-15 DIAGNOSIS — F39 Unspecified mood [affective] disorder: Secondary | ICD-10-CM | POA: Diagnosis not present

## 2020-10-15 DIAGNOSIS — Z0000 Encounter for general adult medical examination without abnormal findings: Secondary | ICD-10-CM | POA: Diagnosis not present

## 2020-10-15 DIAGNOSIS — G621 Alcoholic polyneuropathy: Secondary | ICD-10-CM | POA: Diagnosis not present

## 2020-10-15 DIAGNOSIS — I10 Essential (primary) hypertension: Secondary | ICD-10-CM

## 2020-10-15 LAB — RENAL FUNCTION PANEL
Albumin: 4.4 g/dL (ref 3.5–5.2)
BUN: 11 mg/dL (ref 6–23)
CO2: 28 mEq/L (ref 19–32)
Calcium: 10.1 mg/dL (ref 8.4–10.5)
Chloride: 100 mEq/L (ref 96–112)
Creatinine, Ser: 1.01 mg/dL (ref 0.40–1.50)
GFR: 75.56 mL/min (ref >60.00)
Glucose, Bld: 106 mg/dL — ABNORMAL HIGH (ref 70–99)
Phosphorus: 2.9 mg/dL (ref 2.3–4.6)
Potassium: 4.5 mEq/L (ref 3.5–5.1)
Sodium: 136 mEq/L (ref 135–145)

## 2020-10-15 LAB — CBC
HCT: 46.1 % (ref 39.0–52.0)
Hemoglobin: 15.5 g/dL (ref 13.0–17.0)
MCHC: 33.6 g/dL (ref 30.0–36.0)
MCV: 92.2 fl (ref 78.0–100.0)
Platelets: 258 10*3/uL (ref 150.0–400.0)
RBC: 5 Mil/uL (ref 4.22–5.81)
RDW: 12.9 % (ref 11.5–15.5)
WBC: 7.8 10*3/uL (ref 4.0–10.5)

## 2020-10-15 LAB — HEPATIC FUNCTION PANEL
ALT: 21 U/L (ref 0–53)
AST: 23 U/L (ref 0–37)
Albumin: 4.4 g/dL (ref 3.5–5.2)
Alkaline Phosphatase: 57 U/L (ref 39–117)
Bilirubin, Direct: 0.1 mg/dL (ref 0.0–0.3)
Total Bilirubin: 0.4 mg/dL (ref 0.2–1.2)
Total Protein: 8.1 g/dL (ref 6.0–8.3)

## 2020-10-15 LAB — VITAMIN B12: Vitamin B-12: 316 pg/mL (ref 211–911)

## 2020-10-15 LAB — HEMOGLOBIN A1C: Hgb A1c MFr Bld: 5.8 % (ref 4.6–6.5)

## 2020-10-15 NOTE — Assessment & Plan Note (Signed)
I have personally reviewed the Medicare Annual Wellness questionnaire and have noted 1. The patient's medical and social history 2. Their use of alcohol, tobacco or illicit drugs 3. Their current medications and supplements 4. The patient's functional ability including ADL's, fall risks, home safety risks and hearing or visual             impairment. 5. Diet and physical activities 6. Evidence for depression or mood disorders  The patients weight, height, BMI and visual acuity have been recorded in the chart I have made referrals, counseling and provided education to the patient based review of the above and I have provided the pt with a written personalized care plan for preventive services.  I have provided you with a copy of your personalized plan for preventive services. Please take the time to review along with your updated medication list.  Still prefers no cancer screening Discussed trying to get some aerobic exercise COVID booster and flu vaccines in the fall

## 2020-10-15 NOTE — Assessment & Plan Note (Addendum)
No persistent depression Okay without meds Never used the anxiety/panic med

## 2020-10-15 NOTE — Assessment & Plan Note (Signed)
Remains abstinent 

## 2020-10-15 NOTE — Assessment & Plan Note (Signed)
BMI 41 Discussed trying some aerobic exercise

## 2020-10-15 NOTE — Assessment & Plan Note (Signed)
BP Readings from Last 3 Encounters:  10/15/20 138/72  01/30/20 (!) 146/86  01/15/20 (!) 164/80   Good control  On spironolactone

## 2020-10-15 NOTE — Progress Notes (Signed)
Hearing Screening   125Hz  250Hz  500Hz  1000Hz  2000Hz  3000Hz  4000Hz  6000Hz  8000Hz   Right ear:   20 20 20  20     Left ear:   20 20 20   0    Vision Screening Comments: November 2021

## 2020-10-15 NOTE — Progress Notes (Signed)
Subjective:    Patient ID: Caleb Arellano, male    DOB: 07/08/1950, 70 y.o.   MRN: [id number]  HPI Here for Medicare wellness visit and follow up of chronic medical conditions This visit occurred during the SARS-CoV-2 public health emergency.  Safety protocols were in place, including screening questions prior to the visit, additional usage of staff PPE, and extensive cleaning of exam room while observing appropriate contact time as indicated for disinfecting solutions.   Reviewed advanced directives Reviewed other doctors----Dr Rafael--dentist, Dr Era Bumpers No hospitalizations or surgeries No alcohol still No tobacco Tries to do some stationary resistance exercise. No cardio Vision okay with glasses----will eventually need cataracts removed Hearing is not great Still falls regularly---related to neuropathy. Recommended walking stick regularly. No injuries Independent with instrumental ADLs No sig memory issues  Having trouble feeling his feet Can't trim nails properly---will get bleeding Needs podiatrist Will have pain if he walks for a while---calves and inside feet  Has remained abstinent Hasn't needed AA  No confusion No abnormal bleeding or bruising Known cirrhosis No leg swelling if he keeps up the spironolactone. Does dry his eyes out some No chest pain or SOB (but stable DOE---especially going up hill with mask on) No dizziness or syncope  Mood is pretty good Gets normal stressors---no regular depression Not anhedonic---drones, HAM radio  Weight is stable BMI is 41 Tries to be careful with eating  Current Outpatient Medications on File Prior to Visit  Medication Sig Dispense Refill  . ALPRAZolam (XANAX) 0.25 MG tablet Take 1 tablet (0.25 mg total) by mouth 2 (two) times daily as needed for anxiety. 20 tablet 0  . folic acid (FOLVITE) 1 MG tablet TAKE 1 TABLET BY MOUTH EVERY DAY 90 tablet 3  . hydrocortisone 2.5 % cream Apply topically 2 (two)  times daily as needed. 30 g 1  . ketoconazole (NIZORAL) 2 % shampoo Apply 1 application topically 2 (two) times a week. (Patient taking differently: Apply 1 application topically as needed.) 120 mL 11  . meclizine (ANTIVERT) 12.5 MG tablet Take 1 tablet (12.5 mg total) by mouth 3 (three) times daily as needed for dizziness. 90 tablet 0  . moxifloxacin (VIGAMOX) 0.5 % ophthalmic solution     . neomycin-polymyxin-hydrocortisone (CORTISPORIN) 3.5-10000-1 OTIC suspension Place 3 drops into the right ear 3 (three) times daily as needed. 10 mL 2  . spironolactone (ALDACTONE) 50 MG tablet TAKE 1 TABLET BY MOUTH EVERY DAY 90 tablet 3  . thiamine (VITAMIN B-1) 100 MG tablet Take 100 mg by mouth daily.      No current facility-administered medications on file prior to visit.    No Known Allergies  Past Medical History:  Diagnosis Date  . 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 Cirrhosis of liver without mention of alcohol   . Gout, unspecified   . Hypertension   . Pure hyperglyceridemia   . Seborrheic dermatitis   . Unspecified hereditary and idiopathic peripheral neuropathy     History reviewed. No pertinent surgical history.  Family History  Problem Relation Age of Onset  . Hypertension Mother   . Diabetes Mother   . Cancer Sister        1 sister with blood cancer, 1 with breast cancer  . Stroke Brother   . Heart disease Neg Hx     Social History   Socioeconomic History  . Marital status: Married    Spouse name: Not on file  . Number of children: 3  .  Years of education: Not on file  . Highest education level: Not on file  Occupational History  . Occupation: disabled  Tobacco Use  . Smoking status: Former Smoker    Types: Cigarettes  . Smokeless tobacco: Never Used  Substance and Sexual Activity  . Alcohol use: No    Comment: alcoholic, stopped recently  . Drug use: No  . Sexual activity: Not on file  Other Topics Concern  . Not on file  Social  History Narrative   No living will   Wife, then daughter [name], would be medical decision maker   Would accept resuscitation   No prolonged tube feeds if cognitively unaware   Social Determinants of Health   Financial Resource Strain: Not on file  Food Insecurity: Not on file  Transportation Needs: Not on file  Physical Activity: Not on file  Stress: Not on file  Social Connections: Not on file  Intimate Partner Violence: Not on file   Review of Systems Appetite is fine Wears seat belt Needed some dental work No suspicious skin lesions No heartburn or dysphagia Bowels are okay---did have spell of constipation a few months ago (miralax helped) Voids okay--stream slow in AM. No sig back or joint pains--may throw his back out at times    Objective:   Physical Exam Constitutional:      Appearance: He is obese.  HENT:     Mouth/Throat:     Comments: No lesions Eyes:     Conjunctiva/sclera: Conjunctivae normal.     Pupils: Pupils are equal, round, and reactive to light.  Cardiovascular:     Rate and Rhythm: Normal rate and regular rhythm.     Pulses: Normal pulses.     Heart sounds: No murmur heard. No gallop.      Comments: Regular skips Pulmonary:     Effort: Pulmonary effort is normal.     Breath sounds: Normal breath sounds. No wheezing or rales.     Comments: gynecomastia Abdominal:     Palpations: Abdomen is soft.     Tenderness: There is no abdominal tenderness.  Musculoskeletal:     Cervical back: Normal range of motion and neck supple.     Right lower leg: No edema.     Left lower leg: No edema.  Skin:    General: Skin is warm.     Findings: No rash.  Neurological:     Mental Status: He is alert and oriented to person, place, and time.     Comments: Lynder Parents, Obama" [phone] [id number] Recall 3/3  Psychiatric:        Mood and Affect: Mood normal.        Behavior: Behavior normal.            Assessment & Plan:

## 2020-10-15 NOTE — Assessment & Plan Note (Signed)
Seems to be stable Needs walking stick

## 2020-10-15 NOTE — Assessment & Plan Note (Signed)
Compensated Continues on the spironolactone

## 2020-10-15 NOTE — Patient Instructions (Signed)
Contact [facility] to get your toenails trimmed.

## 2020-10-15 NOTE — Assessment & Plan Note (Signed)
Consider metformin if worse

## 2020-10-15 NOTE — Assessment & Plan Note (Signed)
See social history 

## 2020-11-29 ENCOUNTER — Telehealth: Payer: Self-pay | Admitting: Internal Medicine

## 2020-11-29 NOTE — Telephone Encounter (Signed)
Patient came in requesting Parking Placard form be filled out. It was place in folder

## 2020-12-09 NOTE — Telephone Encounter (Signed)
Left message to call office. Form up front to pickup.

## 2020-12-09 NOTE — Telephone Encounter (Signed)
Form placed in Dr Letvak's inbox on his desk 

## 2021-01-03 ENCOUNTER — Telehealth: Payer: Self-pay

## 2021-01-03 NOTE — Telephone Encounter (Signed)
Pt left VM on triage line stating that he is having flu like symptoms, of fever and chills, with negative covid test. He would like to know what OTC meds he can take due to his cirrhosis of the liver. He knows there's some kind he can't take but can't remember which ones it is. Please advise.

## 2021-01-03 NOTE — Telephone Encounter (Signed)
Left message to call office. No DPR

## 2021-01-03 NOTE — Telephone Encounter (Signed)
His liver has been doing okay----so he can take tylenol 500-625 mg up to 3 times a day during the illness

## 2021-01-03 NOTE — Telephone Encounter (Signed)
Pt also sent a MyChart message so I copied Dr Alla German note from here and pasted it to the MyChart message.

## 2021-01-03 NOTE — Telephone Encounter (Signed)
Erroneous encounter

## 2021-01-08 ENCOUNTER — Other Ambulatory Visit: Payer: Self-pay | Admitting: Internal Medicine

## 2021-05-28 ENCOUNTER — Telehealth: Payer: Self-pay | Admitting: Internal Medicine

## 2021-05-28 MED ORDER — NEOMYCIN-POLYMYXIN-HC 3.5-10000-1 OT SUSP: 3.0000 [drp] | Freq: Three times a day (TID) | OTIC | 2 refills | Status: DC | PRN

## 2021-05-28 NOTE — Telephone Encounter (Signed)
Refill sent.

## 2021-05-28 NOTE — Telephone Encounter (Signed)
  Encourage patient to contact the pharmacy for refills or they can request refills through [place]:  Please schedule appointment if longer than 1 year  NEXT APPOINTMENT DATE:10/17/21  MEDICATION:neomycin-polymyxin-hydrocortisone (CORTISPORIN) 3.5-10000-1 OTIC suspension  Is the patient out of medication? yes  PHARMACY:[id number] - [place], [place] - [address]  Let patient know to contact pharmacy at the end of the day to make sure medication is ready.  Please notify patient to allow 48-72 hours to process  CLINICAL FILLS OUT ALL BELOW:   LAST REFILL:  QTY:  REFILL DATE:    OTHER COMMENTS:    Okay for refill?  Please advise

## 2021-05-28 NOTE — Telephone Encounter (Signed)
LOV - 10/15/20 NOV - 10/17/20 RF - 07/13/2018 #10 ML/2

## 2021-06-03 ENCOUNTER — Encounter: Payer: Self-pay | Admitting: Internal Medicine

## 2021-06-06 ENCOUNTER — Encounter: Payer: Self-pay | Admitting: Internal Medicine

## 2021-06-06 ENCOUNTER — Other Ambulatory Visit: Payer: Self-pay

## 2021-06-06 ENCOUNTER — Ambulatory Visit (INDEPENDENT_AMBULATORY_CARE_PROVIDER_SITE_OTHER): Payer: Medicare HMO | Admitting: Internal Medicine

## 2021-06-06 VITALS — BP 140/84 | HR 64 | Temp 98.3°F | Ht 68.5 in | Wt 284.0 lb

## 2021-06-06 DIAGNOSIS — H6691 Otitis media, unspecified, right ear: Secondary | ICD-10-CM | POA: Insufficient documentation

## 2021-06-06 MED ORDER — AMOXICILLIN 500 MG PO TABS: 1000.0000 mg | ORAL_TABLET | Freq: Two times a day (BID) | ORAL | 0 refills | Status: AC

## 2021-06-06 NOTE — Assessment & Plan Note (Addendum)
Has some symptoms that suggest he may have ruptured and drained Will treat with amoxil Stop the ear drops Will set up with ENT  Continue tylenol

## 2021-06-06 NOTE — Progress Notes (Signed)
Subjective:    Patient ID: Caleb Arellano, male    DOB: [birth year], 70 y.o.   MRN: [id number]  HPI Here due to persistent right ear fullness  Has had trouble for a couple of weeks No recent travel to mountains or on plane Having squeaking sound (wife could even hear) Trying to pop ears --unsuccessful  Got really bad and was considering going to urgent care (12/7) Then woke yesterday morning with a bunch of drainage (clear and sticky with a ?scab)  Hearing is off Chronic ringing  Previous cerumen problems Did use ear drops (Rx) and also peroxide  Current Outpatient Medications on File Prior to Visit  Medication Sig Dispense Refill   ALPRAZolam (XANAX) 0.25 MG tablet Take 1 tablet (0.25 mg total) by mouth 2 (two) times daily as needed for anxiety. 20 tablet 0   folic acid (FOLVITE) 1 MG tablet TAKE 1 TABLET BY MOUTH EVERY DAY 90 tablet 3   hydrocortisone 2.5 % cream Apply topically 2 (two) times daily as needed. 30 g 1   ketoconazole (NIZORAL) 2 % shampoo Apply 1 application topically 2 (two) times a week. (Patient taking differently: Apply 1 application topically as needed.) 120 mL 11   meclizine (ANTIVERT) 12.5 MG tablet Take 1 tablet (12.5 mg total) by mouth 3 (three) times daily as needed for dizziness. 90 tablet 0   moxifloxacin (VIGAMOX) 0.5 % ophthalmic solution      spironolactone (ALDACTONE) 50 MG tablet TAKE 1 TABLET BY MOUTH EVERY DAY 90 tablet 3   thiamine (VITAMIN B-1) 100 MG tablet Take 100 mg by mouth daily.      neomycin-polymyxin-hydrocortisone (CORTISPORIN) 3.5-10000-1 OTIC suspension Place 3 drops into the right ear 3 (three) times daily as needed. (Patient not taking: Reported on 06/06/2021) 10 mL 2   No current facility-administered medications on file prior to visit.    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 No pertinent surgical history.  Family History  Problem Relation Age of Onset   Hypertension Mother    Diabetes Mother    Cancer Sister        1 sister with blood cancer, 1 with breast cancer   Stroke Brother    Heart disease Neg Hx     Social History   Socioeconomic History   Marital status: Married    Spouse name: Not on file   Number of children: 3   Years of education: Not on file   Highest education level: Not on file  Occupational History   Occupation: disabled  Tobacco Use   Smoking status: Former    Types: Cigarettes   Smokeless tobacco: Never  Substance and Sexual Activity   Alcohol use: No    Comment: alcoholic, stopped recently   Drug use: No   Sexual activity: Not on file  Other Topics Concern   Not on file  Social History Narrative   No living will   Wife, then daughter [name], would be medical decision maker   Would accept resuscitation   No prolonged tube feeds if cognitively unaware   Social Determinants of Health   Financial Resource Strain: Not on file  Food Insecurity: Not  on file  Transportation Needs: Not on file  Physical Activity: Not on file  Stress: Not on file  Social Connections: Not on file  Intimate Partner Violence: Not on file   Review of Systems Had respiratory infection some months ago--no recent symptoms No nasal congestion or rhinorrhea     Objective:   Physical Exam Constitutional:      Appearance: Normal appearance.  HENT:     Ears:     Comments: No tragal tenderness either side Cerumen ~75% on left. Canal otherwise normal   Canal has edema with cerumen around outside TM is dull and red/inflamed (only partially visible due to cerumen) Neurological:     Mental Status: He is alert.           Assessment & Plan:

## 2021-06-12 DIAGNOSIS — H60391 Other infective otitis externa, right ear: Secondary | ICD-10-CM | POA: Diagnosis not present

## 2021-06-12 DIAGNOSIS — H6122 Impacted cerumen, left ear: Secondary | ICD-10-CM | POA: Diagnosis not present

## 2021-06-20 DIAGNOSIS — H60391 Other infective otitis externa, right ear: Secondary | ICD-10-CM | POA: Diagnosis not present

## 2021-07-02 DIAGNOSIS — H60391 Other infective otitis externa, right ear: Secondary | ICD-10-CM | POA: Diagnosis not present

## 2021-07-10 DIAGNOSIS — H903 Sensorineural hearing loss, bilateral: Secondary | ICD-10-CM | POA: Diagnosis not present

## 2021-07-10 DIAGNOSIS — H60391 Other infective otitis externa, right ear: Secondary | ICD-10-CM | POA: Diagnosis not present

## 2021-07-30 DIAGNOSIS — Z0100 Encounter for examination of eyes and vision without abnormal findings: Secondary | ICD-10-CM | POA: Diagnosis not present

## 2021-07-30 DIAGNOSIS — H353131 Nonexudative age-related macular degeneration, bilateral, early dry stage: Secondary | ICD-10-CM | POA: Diagnosis not present

## 2021-07-30 DIAGNOSIS — H2513 Age-related nuclear cataract, bilateral: Secondary | ICD-10-CM | POA: Diagnosis not present

## 2021-07-30 DIAGNOSIS — H0102B Squamous blepharitis left eye, upper and lower eyelids: Secondary | ICD-10-CM | POA: Diagnosis not present

## 2021-07-30 DIAGNOSIS — H0102A Squamous blepharitis right eye, upper and lower eyelids: Secondary | ICD-10-CM | POA: Diagnosis not present

## 2021-07-30 DIAGNOSIS — H524 Presbyopia: Secondary | ICD-10-CM | POA: Diagnosis not present

## 2021-07-30 DIAGNOSIS — H25013 Cortical age-related cataract, bilateral: Secondary | ICD-10-CM | POA: Diagnosis not present

## 2021-07-30 LAB — HM DIABETES EYE EXAM

## 2021-08-04 DIAGNOSIS — H6122 Impacted cerumen, left ear: Secondary | ICD-10-CM | POA: Diagnosis not present

## 2021-08-04 DIAGNOSIS — H60391 Other infective otitis externa, right ear: Secondary | ICD-10-CM | POA: Diagnosis not present

## 2021-08-08 DIAGNOSIS — H60391 Other infective otitis externa, right ear: Secondary | ICD-10-CM | POA: Diagnosis not present

## 2021-08-08 DIAGNOSIS — H6061 Unspecified chronic otitis externa, right ear: Secondary | ICD-10-CM | POA: Diagnosis not present

## 2021-08-14 DIAGNOSIS — H60391 Other infective otitis externa, right ear: Secondary | ICD-10-CM | POA: Diagnosis not present

## 2021-08-22 DIAGNOSIS — H60391 Other infective otitis externa, right ear: Secondary | ICD-10-CM | POA: Diagnosis not present

## 2021-08-29 DIAGNOSIS — H903 Sensorineural hearing loss, bilateral: Secondary | ICD-10-CM | POA: Diagnosis not present

## 2021-08-29 DIAGNOSIS — H60391 Other infective otitis externa, right ear: Secondary | ICD-10-CM | POA: Diagnosis not present

## 2021-09-24 DIAGNOSIS — H60391 Other infective otitis externa, right ear: Secondary | ICD-10-CM | POA: Diagnosis not present

## 2021-09-24 DIAGNOSIS — H6122 Impacted cerumen, left ear: Secondary | ICD-10-CM | POA: Diagnosis not present

## 2021-10-02 DIAGNOSIS — H60391 Other infective otitis externa, right ear: Secondary | ICD-10-CM | POA: Diagnosis not present

## 2021-10-08 DIAGNOSIS — H60391 Other infective otitis externa, right ear: Secondary | ICD-10-CM | POA: Diagnosis not present

## 2021-10-14 ENCOUNTER — Other Ambulatory Visit: Payer: Self-pay | Admitting: Otolaryngology

## 2021-10-14 ENCOUNTER — Ambulatory Visit
Admission: RE | Admit: 2021-10-14 | Discharge: 2021-10-14 | Disposition: A | Discharge status: Home / Self Care | Payer: Medicare HMO | Source: Ambulatory Visit | Attending: Otolaryngology | Admitting: Otolaryngology

## 2021-10-14 DIAGNOSIS — H60391 Other infective otitis externa, right ear: Secondary | ICD-10-CM

## 2021-10-14 DIAGNOSIS — H663X1 Other chronic suppurative otitis media, right ear: Secondary | ICD-10-CM | POA: Diagnosis not present

## 2021-10-14 DIAGNOSIS — J3489 Other specified disorders of nose and nasal sinuses: Secondary | ICD-10-CM | POA: Diagnosis not present

## 2021-10-14 DIAGNOSIS — H6091 Unspecified otitis externa, right ear: Secondary | ICD-10-CM | POA: Diagnosis not present

## 2021-10-17 ENCOUNTER — Encounter: Payer: Medicare HMO | Admitting: Internal Medicine

## 2021-10-17 ENCOUNTER — Ambulatory Visit: Payer: Medicare HMO | Admitting: Internal Medicine

## 2021-10-20 DIAGNOSIS — H60391 Other infective otitis externa, right ear: Secondary | ICD-10-CM | POA: Diagnosis not present

## 2021-10-20 DIAGNOSIS — H663X9 Other chronic suppurative otitis media, unspecified ear: Secondary | ICD-10-CM | POA: Diagnosis not present

## 2021-10-28 DIAGNOSIS — H60391 Other infective otitis externa, right ear: Secondary | ICD-10-CM | POA: Diagnosis not present

## 2021-10-28 DIAGNOSIS — H60541 Acute eczematoid otitis externa, right ear: Secondary | ICD-10-CM | POA: Diagnosis not present

## 2021-11-06 ENCOUNTER — Encounter: Payer: Self-pay | Admitting: Internal Medicine

## 2021-11-28 ENCOUNTER — Ambulatory Visit (INDEPENDENT_AMBULATORY_CARE_PROVIDER_SITE_OTHER): Payer: Medicare HMO | Admitting: Internal Medicine

## 2021-11-28 ENCOUNTER — Encounter: Payer: Self-pay | Admitting: Internal Medicine

## 2021-11-28 VITALS — BP 140/88 | HR 87 | Temp 97.9°F | Ht 68.5 in | Wt 285.0 lb

## 2021-11-28 DIAGNOSIS — F1021 Alcohol dependence, in remission: Secondary | ICD-10-CM

## 2021-11-28 DIAGNOSIS — Z0000 Encounter for general adult medical examination without abnormal findings: Secondary | ICD-10-CM | POA: Diagnosis not present

## 2021-11-28 DIAGNOSIS — I10 Essential (primary) hypertension: Secondary | ICD-10-CM

## 2021-11-28 DIAGNOSIS — K7030 Alcoholic cirrhosis of liver without ascites: Secondary | ICD-10-CM | POA: Diagnosis not present

## 2021-11-28 DIAGNOSIS — F39 Unspecified mood [affective] disorder: Secondary | ICD-10-CM

## 2021-11-28 DIAGNOSIS — G621 Alcoholic polyneuropathy: Secondary | ICD-10-CM

## 2021-11-28 LAB — CBC
HCT: 45.2 % (ref 39.0–52.0)
Hemoglobin: 14.9 g/dL (ref 13.0–17.0)
MCHC: 33 g/dL (ref 30.0–36.0)
MCV: 92.3 fl (ref 78.0–100.0)
Platelets: 234 10*3/uL (ref 150.0–400.0)
RBC: 4.9 Mil/uL (ref 4.22–5.81)
RDW: 13.1 % (ref 11.5–15.5)
WBC: 7.1 10*3/uL (ref 4.0–10.5)

## 2021-11-28 LAB — RENAL FUNCTION PANEL
Albumin: 4.3 g/dL (ref 3.5–5.2)
BUN: 12 mg/dL (ref 6–23)
CO2: 27 mEq/L (ref 19–32)
Calcium: 9.9 mg/dL (ref 8.4–10.5)
Chloride: 103 mEq/L (ref 96–112)
Creatinine, Ser: 0.96 mg/dL (ref 0.40–1.50)
GFR: 79.68 mL/min (ref >60.00)
Glucose, Bld: 102 mg/dL — ABNORMAL HIGH (ref 70–99)
Phosphorus: 2.4 mg/dL (ref 2.3–4.6)
Potassium: 4.7 mEq/L (ref 3.5–5.1)
Sodium: 138 mEq/L (ref 135–145)

## 2021-11-28 LAB — T4, FREE: Free T4: 0.94 ng/dL (ref 0.60–1.60)

## 2021-11-28 LAB — HEPATIC FUNCTION PANEL
ALT: 21 U/L (ref 0–53)
AST: 22 U/L (ref 0–37)
Albumin: 4.3 g/dL (ref 3.5–5.2)
Alkaline Phosphatase: 54 U/L (ref 39–117)
Bilirubin, Direct: 0.1 mg/dL (ref 0.0–0.3)
Total Bilirubin: 0.5 mg/dL (ref 0.2–1.2)
Total Protein: 7.9 g/dL (ref 6.0–8.3)

## 2021-11-28 NOTE — Assessment & Plan Note (Signed)
Worse now He does use a cane for balance On thiamine

## 2021-11-28 NOTE — Progress Notes (Signed)
Hearing Screening - Comments:: ENT Checked earlier this year. Recommended hearing aids. Vision Screening - Comments:: July 2022

## 2021-11-28 NOTE — Assessment & Plan Note (Signed)
BP Readings from Last 3 Encounters:  11/28/21 140/88  06/06/21 140/84  10/15/20 138/72   Fine with just the spironolactone 50 daily

## 2021-11-28 NOTE — Assessment & Plan Note (Signed)
Remains abstinent 

## 2021-11-28 NOTE — Assessment & Plan Note (Signed)
Discussed getting back to more exercise and care with eating

## 2021-11-28 NOTE — Assessment & Plan Note (Signed)
I have personally reviewed the Medicare Annual Wellness questionnaire and have noted 1. The patient's medical and social history 2. Their use of alcohol, tobacco or illicit drugs 3. Their current medications and supplements 4. The patient's functional ability including ADL's, fall risks, home safety risks and hearing or visual             impairment. 5. Diet and physical activities 6. Evidence for depression or mood disorders  The patients weight, height, BMI and visual acuity have been recorded in the chart I have made referrals, counseling and provided education to the patient based review of the above and I have provided the pt with a written personalized care plan for preventive services.  I have provided you with a copy of your personalized plan for preventive services. Please take the time to review along with your updated medication list.  Discussed exercise Doesn't want cancer screening Will get COVID booster and flu vaccine in the fall Consider shingrix---and get Td-- at the pharmacy

## 2021-11-28 NOTE — Assessment & Plan Note (Signed)
Still seems compensated since stopping alcohol

## 2021-11-28 NOTE — Assessment & Plan Note (Signed)
No depression but now some agoraphobia Has the xanax for panic No depression

## 2021-11-28 NOTE — Progress Notes (Signed)
Subjective:    Patient ID: Glenice Laine, male    DOB: 1951/02/05, 71 y.o.   MRN: [id number]  HPI Here for Medicare wellness visit and follow up of chronic health conditions Reviewed form and advanced directives Reviewed other doctors No alcohol or tobacco Vision is okay Hearing has dropped a lot in right ear  No falls this year No depression and not anhedonic Has been sedentary with ear problem--now starting again Independent with instrumental ADLs Mild memory issues---recall issues mostly  Had ongoing problems with the external otitis Would actually keep him up Finally did improve after a while Actually needed antifungal (for athlete's foot) This was stressful  Only just able to be doing some activity since this Ongoing neuropathy---has cane  No depression No recent panic--but now afraid of elevators, or getting stuck in traffic Hasn't used the xanax  Has remained abstinent from alcohol Gets some edema---especially with prednisone for his ear Had been off spironolactone for a while--but back on for 2 months Big stomach--unclear if any fluid in there  BMI still 42 Trying to watch his eating  Easy DOE---relates to fitness No chest pain of note---will get occasional gas (gone after belching) Gets some dizziness if he turns head fast. No lightheadedness. Hasn't used the meclizine lately No syncope Heart skips all the time---no racing  Current Outpatient Medications on File Prior to Visit  Medication Sig Dispense Refill   ALPRAZolam (XANAX) 0.25 MG tablet Take 1 tablet (0.25 mg total) by mouth 2 (two) times daily as needed for anxiety. 20 tablet 0   folic acid (FOLVITE) 1 MG tablet TAKE 1 TABLET BY MOUTH EVERY DAY 90 tablet 3   hydrocortisone 2.5 % cream Apply topically 2 (two) times daily as needed. 30 g 1   ketoconazole (NIZORAL) 2 % shampoo Apply 1 application topically 2 (two) times a week. (Patient taking differently: Apply 1 application. topically as needed.) 120  mL 11   meclizine (ANTIVERT) 12.5 MG tablet Take 1 tablet (12.5 mg total) by mouth 3 (three) times daily as needed for dizziness. 90 tablet 0   spironolactone (ALDACTONE) 50 MG tablet TAKE 1 TABLET BY MOUTH EVERY DAY 90 tablet 3   thiamine (VITAMIN B-1) 100 MG tablet Take 100 mg by mouth daily.      No current facility-administered medications on file prior to visit.    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 No pertinent surgical history.  Family History  Problem Relation Age of Onset   Hypertension Mother    Diabetes Mother    Cancer Sister        1 sister with blood cancer, 1 with breast cancer   Stroke Brother    Heart disease Neg Hx     Social History   Socioeconomic History   Marital status: Married    Spouse name: Not on file   Number of children: 3   Years of education: Not on file   Highest education level: Not on file  Occupational History   Occupation: disabled  Tobacco Use   Smoking status: Former    Types: Cigarettes    Passive exposure: Never   Smokeless tobacco: Never  Substance and Sexual Activity   Alcohol use: No    Comment: alcoholic, stopped recently   Drug use: No   Sexual activity: Not on file  Other Topics Concern   Not on file  Social History Narrative   No living will   Wife, then daughter [name], would be medical decision maker   Would accept resuscitation   No prolonged tube feeds if cognitively unaware   Social Determinants of Health   Financial Resource Strain: Not on file  Food Insecurity: Not on file  Transportation Needs: Not on file  Physical Activity: Not on file  Stress: Not on file  Social Connections: Not on file   Intimate Partner Violence: Not on file   Review of Systems Appetite is fine Weight fairly stable Sleeps fairly good---never a great sleeper (5 hours max) Wears seat belt Needs a tooth pulled Some congestion in throat--will have to clear No known allergy issues No heartburn or dysphagia Bowels move fine--no blood Some pain in right shoulder and in hands Slight tremor in right thumb--depends on how he holds it No suspicious skin lesions--chronic moles and skin tag    Objective:   Physical Exam Constitutional:      Appearance: Normal appearance.  HENT:     Right Ear: Tympanic membrane and ear canal normal.     Left Ear: Tympanic membrane and ear canal normal.     Mouth/Throat:     Comments: No lesions Eyes:     Conjunctiva/sclera: Conjunctivae normal.     Pupils: Pupils are equal, round, and reactive to light.  Cardiovascular:     Rate and Rhythm: Normal rate and regular rhythm.     Pulses: Normal pulses.     Heart sounds: No murmur heard.   No gallop.  Pulmonary:     Effort: Pulmonary effort is normal.     Breath sounds: Normal breath sounds. No wheezing or rales.  Abdominal:     Palpations: Abdomen is soft.     Tenderness: There is no abdominal tenderness. There is no guarding.     Comments: Large abdomen but no obvious ascites  Musculoskeletal:     Cervical back: Neck supple.     Right lower leg: No edema.     Left lower leg: No edema.     Comments: Slight tightness in calves  Lymphadenopathy:     Cervical: No cervical adenopathy.  Skin:    Findings: No lesion or rash.  Neurological:     General: No focal deficit present.     Mental Status: He is alert and oriented to person, place, and time.     Comments: Mini-cog normal Little sensation in feet  Psychiatric:        Mood and Affect: Mood normal.        Behavior: Behavior normal.           Assessment & Plan:

## 2022-04-10 ENCOUNTER — Encounter: Payer: Self-pay | Admitting: Family Medicine

## 2022-04-10 ENCOUNTER — Ambulatory Visit (INDEPENDENT_AMBULATORY_CARE_PROVIDER_SITE_OTHER): Payer: Medicare HMO | Admitting: Family Medicine

## 2022-04-10 VITALS — BP 144/82 | HR 90 | Temp 98.0°F | Ht 68.5 in | Wt 276.2 lb

## 2022-04-10 DIAGNOSIS — K7030 Alcoholic cirrhosis of liver without ascites: Secondary | ICD-10-CM | POA: Diagnosis not present

## 2022-04-10 DIAGNOSIS — Z23 Encounter for immunization: Secondary | ICD-10-CM | POA: Diagnosis not present

## 2022-04-10 DIAGNOSIS — R109 Unspecified abdominal pain: Secondary | ICD-10-CM | POA: Diagnosis not present

## 2022-04-10 LAB — POC URINALSYSI DIPSTICK (AUTOMATED)
Bilirubin, UA: NEGATIVE
Blood, UA: NEGATIVE
Glucose, UA: NEGATIVE
Leukocytes, UA: NEGATIVE
Nitrite, UA: NEGATIVE
Protein, UA: NEGATIVE
Spec Grav, UA: 1.015 (ref 1.010–1.025)
Urobilinogen, UA: 0.2 E.U./dL
pH, UA: 6 (ref 5.0–8.0)

## 2022-04-10 LAB — PROTIME-INR
INR: 1.1 ratio — ABNORMAL HIGH (ref 0.8–1.0)
Prothrombin Time: 12.3 s (ref 9.6–13.1)

## 2022-04-10 LAB — CBC WITH DIFFERENTIAL/PLATELET
Basophils Absolute: 0.1 10*3/uL (ref 0.0–0.1)
Basophils Relative: 0.9 % (ref 0.0–3.0)
Eosinophils Absolute: 0.2 10*3/uL (ref 0.0–0.7)
Eosinophils Relative: 2.2 % (ref 0.0–5.0)
HCT: 46.8 % (ref 39.0–52.0)
Hemoglobin: 15.6 g/dL (ref 13.0–17.0)
Lymphocytes Relative: 21 % (ref 12.0–46.0)
Lymphs Abs: 1.4 10*3/uL (ref 0.7–4.0)
MCHC: 33.4 g/dL (ref 30.0–36.0)
MCV: 92.5 fl (ref 78.0–100.0)
Monocytes Absolute: 0.7 10*3/uL (ref 0.1–1.0)
Monocytes Relative: 10.4 % (ref 3.0–12.0)
Neutro Abs: 4.5 10*3/uL (ref 1.4–7.7)
Neutrophils Relative %: 65.5 % (ref 43.0–77.0)
Platelets: 229 10*3/uL (ref 150.0–400.0)
RBC: 5.06 Mil/uL (ref 4.22–5.81)
RDW: 13.3 % (ref 11.5–15.5)
WBC: 6.9 10*3/uL (ref 4.0–10.5)

## 2022-04-10 LAB — COMPREHENSIVE METABOLIC PANEL
ALT: 27 U/L (ref 0–53)
AST: 25 U/L (ref 0–37)
Albumin: 4.5 g/dL (ref 3.5–5.2)
Alkaline Phosphatase: 71 U/L (ref 39–117)
BUN: 10 mg/dL (ref 6–23)
CO2: 24 mEq/L (ref 19–32)
Calcium: 9.6 mg/dL (ref 8.4–10.5)
Chloride: 101 mEq/L (ref 96–112)
Creatinine, Ser: 1.03 mg/dL (ref 0.40–1.50)
GFR: 73.04 mL/min (ref >60.00)
Glucose, Bld: 119 mg/dL — ABNORMAL HIGH (ref 70–99)
Potassium: 4.2 mEq/L (ref 3.5–5.1)
Sodium: 136 mEq/L (ref 135–145)
Total Bilirubin: 0.6 mg/dL (ref 0.2–1.2)
Total Protein: 8 g/dL (ref 6.0–8.3)

## 2022-04-10 LAB — LIPASE: Lipase: 63 U/L — ABNORMAL HIGH (ref 11.0–59.0)

## 2022-04-10 NOTE — Assessment & Plan Note (Signed)
Several weeks of nonspecific R sided abdominal discomfort associated with increased gassiness. He does endorse some early satiety and weight loss. Check labs, UA, check abd [place] to start evaluation, consider further imaging pending results.  Not consistent with kidney stone, pancreatitis, appendicitis or other acute abdomen.  Discussed trial of pepto bismol, maalox for indigestion symptoms.  Pt agrees with plan.

## 2022-04-10 NOTE — Addendum Note (Signed)
Addended by: Brenton Grills on: 40/68/4033 11:22 AM   Modules accepted: Orders

## 2022-04-10 NOTE — Assessment & Plan Note (Signed)
H/o this from 2007, abstinent since.  Update labwork, abd [place].

## 2022-04-10 NOTE — Patient Instructions (Addendum)
Flu shot today  Check labs and urinalysis today.  We will order abdominal ultrasound for further evaluation as well  You may call [phone] to schedule appointment.  Let us know if new or worsening symptoms.  May try pepto bismol or maalox.

## 2022-04-10 NOTE — Progress Notes (Addendum)
Patient ID: Caleb Arellano, male    DOB: [birth year], 71 y.o.   MRN: [id number]  This visit was conducted in person.  BP (!) 144/82 (BP Location: Right Arm, Cuff Size: Large)   Pulse 90   Temp 98 F (36.7 C) (Temporal)   Ht 5' 8.5" (1.74 m)   Wt 276 lb 4 oz (125.3 kg)   SpO2 95%   BMI 41.39 kg/m    CC: R flank pain  Subjective:   HPI: Caleb Arellano is a 71 y.o. male presenting on 04/10/2022 for Flank Pain (C/o R flank pain, feeling queasy and hears a lot of gurgling after eating.  Feels food move through abd after eating. Sxs started about 2 wks ago but have improved.  )   2-3 wk h/o RUQ discomfort, worse after eating a meal "feel something moving through me". Points to RUQ and R side. Can have associated nausea, gurgling, increased gassiness.  3 wks ago had some R flank pain as well as felt feverish.   Notes 9 lb weight loss over the past 4 months, unintentional.  Endorses some early satiety.  No diarrhea, vomiting, dysphagia, blood in stool or urine. No GERD symptoms.   H/o alcoholic cirrhosis, he quit drinking 2007.      Relevant past medical, surgical, family and social history reviewed and updated as indicated. Interim medical history since our last visit reviewed. Allergies and medications reviewed and updated. Outpatient Medications Prior to Visit  Medication Sig Dispense Refill   ALPRAZolam (XANAX) 0.25 MG tablet Take 1 tablet (0.25 mg total) by mouth 2 (two) times daily as needed for anxiety. 20 tablet 0   folic acid (FOLVITE) 1 MG tablet TAKE 1 TABLET BY MOUTH EVERY DAY 90 tablet 3   hydrocortisone 2.5 % cream Apply topically 2 (two) times daily as needed. 30 g 1   ketoconazole (NIZORAL) 2 % shampoo Apply 1 application topically 2 (two) times a week. (Patient taking differently: Apply 1 application  topically as needed.) 120 mL 11   meclizine (ANTIVERT) 12.5 MG tablet Take 1 tablet (12.5 mg total) by mouth 3 (three) times daily as needed for dizziness. 90 tablet 0    spironolactone (ALDACTONE) 50 MG tablet TAKE 1 TABLET BY MOUTH EVERY DAY (Patient taking differently: Takes every other day) 90 tablet 3   thiamine (VITAMIN B-1) 100 MG tablet Take 100 mg by mouth daily.      No facility-administered medications prior to visit.     Per HPI unless specifically indicated in ROS section below Review of Systems  Objective:  BP (!) 144/82 (BP Location: Right Arm, Cuff Size: Large)   Pulse 90   Temp 98 F (36.7 C) (Temporal)   Ht 5' 8.5" (1.74 m)   Wt 276 lb 4 oz (125.3 kg)   SpO2 95%   BMI 41.39 kg/m   Wt Readings from Last 3 Encounters:  04/10/22 276 lb 4 oz (125.3 kg)  11/28/21 285 lb (129.3 kg)  06/06/21 284 lb (128.8 kg)      Physical Exam Vitals and nursing note reviewed.  Constitutional:      Appearance: Normal appearance. He is not ill-appearing.  HENT:     Mouth/Throat:     Mouth: Mucous membranes are moist.     Pharynx: Oropharynx is clear. No oropharyngeal exudate or posterior oropharyngeal erythema.  Eyes:     Extraocular Movements: Extraocular movements intact.     Conjunctiva/sclera: Conjunctivae normal.     Pupils: Pupils  are equal, round, and reactive to light.  Cardiovascular:     Rate and Rhythm: Normal rate and regular rhythm.     Pulses: Normal pulses.     Heart sounds: Normal heart sounds. No murmur heard. Pulmonary:     Effort: Pulmonary effort is normal. No respiratory distress.     Breath sounds: Normal breath sounds. No wheezing, rhonchi or rales.  Abdominal:     General: Bowel sounds are normal. There is no distension.     Palpations: Abdomen is soft. There is fluid wave (mild). There is no hepatomegaly, splenomegaly or mass.     Tenderness: There is abdominal tenderness (mild discomfort) in the right upper quadrant and epigastric area. There is no right CVA tenderness, left CVA tenderness, guarding or rebound. Negative signs include Murphy's sign.     Hernia: No hernia is present.  Musculoskeletal:     Right  lower leg: No edema.     Left lower leg: No edema.  Skin:    General: Skin is warm and dry.     Findings: No rash.  Neurological:     Mental Status: He is alert.  Psychiatric:        Mood and Affect: Mood normal.        Behavior: Behavior normal.       Results for orders placed or performed in visit on 04/10/22  POCT Urinalysis Dipstick (Automated)  Result Value Ref Range   Color, UA yellow    Clarity, UA clear    Glucose, UA Negative Negative   Bilirubin, UA negative    Ketones, UA 2+    Spec Grav, UA 1.015 1.010 - 1.025   Blood, UA negative    pH, UA 6.0 5.0 - 8.0   Protein, UA Negative Negative   Urobilinogen, UA 0.2 0.2 or 1.0 E.U./dL   Nitrite, UA negative    Leukocytes, UA Negative Negative    Assessment & Plan:   Problem List Items Addressed This Visit     Hepatic cirrhosis ([place])    H/o this from 2007, abstinent since.  Update labwork, abd [place].       Right sided abdominal pain - Primary    Several weeks of nonspecific R sided abdominal discomfort associated with increased gassiness. He does endorse some early satiety and weight loss. Check labs, UA, check abd [place] to start evaluation, consider further imaging pending results.  Not consistent with kidney stone, pancreatitis, appendicitis or other acute abdomen.  Discussed trial of pepto bismol, maalox for indigestion symptoms.  Pt agrees with plan.       Relevant Orders   Comprehensive metabolic panel   CBC with Differential/Platelet   Lipase   Protime-INR   US Abdomen Complete   POCT Urinalysis Dipstick (Automated) (Completed)   Other Visit Diagnoses     Need for influenza vaccination       Relevant Orders   Flu Vaccine QUAD High Dose(Fluad) (Completed)        No orders of the defined types were placed in this encounter.  Orders Placed This Encounter  Procedures   US Abdomen Complete    Standing Status:   Future    Standing Expiration Date:   04/11/2023    Order Specific Question:   Reason for  Exam (SYMPTOM  OR DIAGNOSIS REQUIRED)    Answer:   RUQ pain , R flank pain, h/o liver cirrhosis    Order Specific Question:   Preferred imaging location?    Answer:  [facility]   Flu Vaccine QUAD High Dose(Fluad)   Comprehensive metabolic panel   CBC with Differential/Platelet   Lipase   Protime-INR   POCT Urinalysis Dipstick (Automated)    Patient Instructions  Flu shot today  Check labs and urinalysis today.  We will order abdominal ultrasound for further evaluation as well  You may call [phone] to schedule appointment.  Let us know if new or worsening symptoms.  May try pepto bismol or maalox.   Follow up plan: Return if symptoms worsen or fail to improve.  Ria Bush, MD

## 2022-04-14 ENCOUNTER — Ambulatory Visit
Admission: RE | Admit: 2022-04-14 | Discharge: 2022-04-14 | Disposition: A | Discharge status: Home / Self Care | Payer: Medicare HMO | Source: Ambulatory Visit | Attending: Family Medicine | Admitting: Family Medicine

## 2022-04-14 DIAGNOSIS — R109 Unspecified abdominal pain: Secondary | ICD-10-CM | POA: Diagnosis not present

## 2022-04-20 ENCOUNTER — Other Ambulatory Visit: Payer: Self-pay | Admitting: Family Medicine

## 2022-04-20 ENCOUNTER — Telehealth: Payer: Self-pay

## 2022-04-20 ENCOUNTER — Encounter: Payer: Self-pay | Admitting: *Deleted

## 2022-04-20 DIAGNOSIS — R109 Unspecified abdominal pain: Secondary | ICD-10-CM

## 2022-04-20 NOTE — Telephone Encounter (Signed)
Lvm asking pt to call back.  Need to relay ultrasound results and Dr. Synthia Innocent message. (See Imaging, Result Notes- 04/14/22.)   Ultrasound/Dr. Synthia Innocent msg: Your abdominal ultrasound showed possible gallstones vs calcium to gallbladder.  For this reason, I'd suggest more detailed abdominal imaging and I have ordered CT scan of abdomen.   Your pancreas looked ok as did spleen and kidneys.  Liver showed signs of known cirrhosis.

## 2022-04-20 NOTE — Telephone Encounter (Signed)
Pt rtn call.    I relayed results and Dr. Synthia Innocent message.  Pt verbalizes understanding.

## 2022-04-29 ENCOUNTER — Ambulatory Visit
Admission: RE | Admit: 2022-04-29 | Discharge: 2022-04-29 | Disposition: A | Discharge status: Home / Self Care | Payer: Medicare HMO | Source: Ambulatory Visit | Attending: Family Medicine | Admitting: Family Medicine

## 2022-04-29 DIAGNOSIS — I700 Atherosclerosis of aorta: Secondary | ICD-10-CM | POA: Diagnosis not present

## 2022-04-29 DIAGNOSIS — K7460 Unspecified cirrhosis of liver: Secondary | ICD-10-CM | POA: Diagnosis not present

## 2022-04-29 DIAGNOSIS — K828 Other specified diseases of gallbladder: Secondary | ICD-10-CM | POA: Diagnosis not present

## 2022-04-29 DIAGNOSIS — R109 Unspecified abdominal pain: Secondary | ICD-10-CM | POA: Insufficient documentation

## 2022-04-29 DIAGNOSIS — K8020 Calculus of gallbladder without cholecystitis without obstruction: Secondary | ICD-10-CM | POA: Diagnosis not present

## 2022-04-29 MED ORDER — IOHEXOL 350 MG/ML SOLN
100.0000 mL | Freq: Once | INTRAVENOUS | Status: AC | PRN
  Administered 2022-04-29: 100 mL via INTRAVENOUS

## 2022-08-14 ENCOUNTER — Encounter: Payer: Self-pay | Admitting: Internal Medicine

## 2022-08-15 ENCOUNTER — Other Ambulatory Visit: Payer: Self-pay | Admitting: Internal Medicine

## 2022-08-17 MED ORDER — ALPRAZOLAM 0.25 MG PO TABS: 0.2500 mg | ORAL_TABLET | Freq: Two times a day (BID) | ORAL | 0 refills | Status: DC | PRN

## 2022-09-03 DIAGNOSIS — H25013 Cortical age-related cataract, bilateral: Secondary | ICD-10-CM | POA: Diagnosis not present

## 2022-09-03 DIAGNOSIS — H0102B Squamous blepharitis left eye, upper and lower eyelids: Secondary | ICD-10-CM | POA: Diagnosis not present

## 2022-09-03 DIAGNOSIS — H2513 Age-related nuclear cataract, bilateral: Secondary | ICD-10-CM | POA: Diagnosis not present

## 2022-09-03 DIAGNOSIS — H524 Presbyopia: Secondary | ICD-10-CM | POA: Diagnosis not present

## 2022-09-03 DIAGNOSIS — H353131 Nonexudative age-related macular degeneration, bilateral, early dry stage: Secondary | ICD-10-CM | POA: Diagnosis not present

## 2022-09-03 DIAGNOSIS — H0102A Squamous blepharitis right eye, upper and lower eyelids: Secondary | ICD-10-CM | POA: Diagnosis not present

## 2022-09-08 ENCOUNTER — Encounter: Payer: Self-pay | Admitting: Internal Medicine

## 2022-11-05 ENCOUNTER — Encounter: Payer: Self-pay | Admitting: Internal Medicine

## 2022-11-05 ENCOUNTER — Ambulatory Visit (INDEPENDENT_AMBULATORY_CARE_PROVIDER_SITE_OTHER): Payer: Medicare HMO | Admitting: Internal Medicine

## 2022-11-05 VITALS — BP 138/84 | HR 78 | Temp 98.0°F | Ht 68.5 in | Wt 279.0 lb

## 2022-11-05 DIAGNOSIS — M546 Pain in thoracic spine: Secondary | ICD-10-CM | POA: Diagnosis not present

## 2022-11-05 DIAGNOSIS — F39 Unspecified mood [affective] disorder: Secondary | ICD-10-CM

## 2022-11-05 MED ORDER — ALPRAZOLAM 0.5 MG PO TABS: 0.5000 mg | ORAL_TABLET | Freq: Two times a day (BID) | ORAL | 0 refills | Status: DC | PRN

## 2022-11-05 MED ORDER — HYDROCORTISONE 2.5 % EX CREA: TOPICAL_CREAM | Freq: Two times a day (BID) | CUTANEOUS | 1 refills | Status: AC | PRN

## 2022-11-05 NOTE — Assessment & Plan Note (Addendum)
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

## 2022-11-05 NOTE — Assessment & Plan Note (Signed)
No rash--not shingles Likely had mild injury with the fall Reassured Can use the analgesics prn

## 2022-11-05 NOTE — Progress Notes (Signed)
Subjective:    Patient ID: Caleb Arellano, male    DOB: Jul 23, 1950, 72 y.o.   MRN: [id number]  HPI Here due to left flank pain  Started under left ribs about a week ago Burning sensation--then throbbing "like a catch" Very sensitive to touch around the back Okay if sitting still---but if he twists, he gets a pain  Has taken some tylenol/alka seltzer Both help some  Had brief rash by skin tags in that area--otherwise nothing new  He did fall shortly before this Tripped on edge of recliner--hit left arm on stool Didn't hit chest  No SOB Gets regular heart skipping---nothing new No pleurtic component  Current Outpatient Medications on File Prior to Visit  Medication Sig Dispense Refill   ALPRAZolam (XANAX) 0.25 MG tablet Take 1 tablet (0.25 mg total) by mouth 2 (two) times daily as needed for anxiety. 20 tablet 0   folic acid (FOLVITE) 1 MG tablet TAKE 1 TABLET BY MOUTH EVERY DAY 90 tablet 3   hydrocortisone 2.5 % cream Apply topically 2 (two) times daily as needed. 30 g 1   ketoconazole (NIZORAL) 2 % shampoo Apply 1 application topically 2 (two) times a week. (Patient taking differently: Apply 1 application  topically as needed.) 120 mL 11   meclizine (ANTIVERT) 12.5 MG tablet Take 1 tablet (12.5 mg total) by mouth 3 (three) times daily as needed for dizziness. 90 tablet 0   spironolactone (ALDACTONE) 50 MG tablet TAKE 1 TABLET BY MOUTH EVERY DAY (Patient taking differently: Takes every other day) 90 tablet 3   thiamine (VITAMIN B-1) 100 MG tablet Take 100 mg by mouth daily.      No current facility-administered medications on file prior to visit.    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 No pertinent surgical history.  Family History  Problem Relation Age of Onset   Hypertension Mother    Diabetes Mother    Cancer Sister        1 sister with blood cancer, 1 with breast cancer   Stroke Brother    Heart disease Neg Hx     Social History   Socioeconomic History   Marital status: Married    Spouse name: Not on file   Number of children: 3   Years of education: Not on file   Highest education level: Not on file  Occupational History   Occupation: disabled  Tobacco Use   Smoking status: Former    Types: Cigarettes    Passive exposure: Never   Smokeless tobacco: Never  Substance and Sexual Activity   Alcohol use: No    Comment: alcoholic, stopped recently   Drug use: No   Sexual activity: Not on file  Other Topics Concern   Not on file  Social History Narrative   No living will   Wife, then daughter Scarlette Calico, would be medical decision maker   Would accept resuscitation   No prolonged tube feeds if cognitively unaware   Social Determinants of Health   Financial Resource Strain: Not on file  Food Insecurity: Not on file  Transportation Needs: Not on file  Physical Activity: Not on file  Stress: Not  on file  Social Connections: Not on file  Intimate Partner Violence: Not on file   Review of Systems No N/V Appetite off--he is worried about this (and other life stresses) Due for dental work---alprazolam not too helpful before this     Objective:   Physical Exam Constitutional:      Appearance: Normal appearance.  Cardiovascular:     Rate and Rhythm: Normal rate and regular rhythm.     Heart sounds: No murmur heard.    No gallop.  Pulmonary:     Effort: Pulmonary effort is normal.     Breath sounds: Normal breath sounds. No wheezing or rales.     Comments: Tender at posterior 7th (or 8th) rib---near spine Also tenderness at left lower sternal border No discrete rib tenderness Musculoskeletal:     Cervical back: Neck  supple.  Lymphadenopathy:     Cervical: No cervical adenopathy.  Neurological:     Mental Status: He is alert.            Assessment & Plan:

## 2022-11-07 ENCOUNTER — Encounter: Payer: Self-pay | Admitting: Internal Medicine

## 2022-11-07 ENCOUNTER — Ambulatory Visit
Admission: EM | Admit: 2022-11-07 | Discharge: 2022-11-07 | Disposition: A | Discharge status: Home / Self Care | Payer: Medicare HMO | Attending: Urgent Care | Admitting: Urgent Care

## 2022-11-07 DIAGNOSIS — B029 Zoster without complications: Secondary | ICD-10-CM

## 2022-11-07 MED ORDER — VALACYCLOVIR HCL 1 G PO TABS: 1000.0000 mg | ORAL_TABLET | Freq: Three times a day (TID) | ORAL | 0 refills | Status: DC

## 2022-11-07 NOTE — ED Provider Notes (Signed)
Caleb Arellano    CSN: [id number] Arrival date & time: 11/07/22  1314      History   Chief Complaint Chief Complaint  Patient presents with   Rash    HPI Caleb Arellano is a 72 y.o. male.   HPI  Presents to urgent care with complaint of rash to left side of his chest with some discomfort.  He also endorses discomfort on the left side of his back.  Patient states his symptoms started about 2 weeks ago with discomfort in the same spots without rash.  He informed his healthcare provider who diagnosed possible shingles but did not treat because there was no rash.  Rash developed this morning.  PMH includes cirrhosis of the liver and hypertension.  Past Medical History:  Diagnosis Date   Ataxia 04/07   hepatic enceph/ peripheral neuropathy   Cirrhosis of liver without mention of alcohol    Gout, unspecified    Hypertension    Pure hyperglyceridemia    Seborrheic dermatitis    Unspecified hereditary and idiopathic peripheral neuropathy     Patient Active Problem List   Diagnosis Date Noted   Thoracic back pain 11/05/2022   Right sided abdominal pain 04/10/2022   Morbid obesity (HCC) 10/15/2020   Palpitations 10/18/2017   Mood disorder (HCC) 03/30/2017   Advance directive discussed with patient 03/30/2017   Alcohol dependence in remission (HCC) 04/24/2014   Visit for preventive health examination 04/23/2014   Vertigo 06/21/2013   GOUT 02/08/2007   Alcoholic peripheral neuropathy (HCC) 02/08/2007   Essential hypertension, benign 02/08/2007   Hepatic cirrhosis (HCC) 02/08/2007    History reviewed. No pertinent surgical history.     Home Medications    Prior to Admission medications   Medication Sig Start Date End Date Taking? Authorizing Provider  ALPRAZolam Prudy Feeler) 0.5 MG tablet Take 1-2 tablets (0.5-1 mg total) by mouth 2 (two) times daily as needed for anxiety. 11/05/22   Karie Schwalbe, MD  folic acid (FOLVITE) 1 MG tablet TAKE 1 TABLET BY MOUTH  EVERY DAY 10/07/20   Tillman Abide I, MD  hydrocortisone 2.5 % cream Apply topically 2 (two) times daily as needed. 11/05/22   Karie Schwalbe, MD  ketoconazole (NIZORAL) 2 % shampoo Apply 1 application topically 2 (two) times a week. Patient taking differently: Apply 1 application  topically as needed. 03/11/15   Karie Schwalbe, MD  meclizine (ANTIVERT) 12.5 MG tablet Take 1 tablet (12.5 mg total) by mouth 3 (three) times daily as needed for dizziness. 01/15/20   Gweneth Dimitri, MD  spironolactone (ALDACTONE) 50 MG tablet TAKE 1 TABLET BY MOUTH EVERY DAY Patient taking differently: Takes every other day 01/08/21   Karie Schwalbe, MD  thiamine (VITAMIN B-1) 100 MG tablet Take 100 mg by mouth daily.     [provider]    Family History Family History  Problem Relation Age of Onset   Hypertension Mother    Diabetes Mother    Cancer Sister        1 sister with blood cancer, 1 with breast cancer   Stroke Brother    Heart disease Neg Hx     Social History Social History   Tobacco Use   Smoking status: Former    Types: Cigarettes    Passive exposure: Never   Smokeless tobacco: Never  Substance Use Topics   Alcohol use: No    Comment: alcoholic, stopped recently   Drug use: No  Allergies   Patient has no known allergies.   Review of Systems Review of Systems   Physical Exam Triage Vital Signs ED Triage Vitals  Enc Vitals Group     BP 11/07/22 1352 (!) 168/84     Pulse Rate 11/07/22 1352 86     Resp 11/07/22 1352 20     Temp 11/07/22 1352 98.2 F (36.8 C)     Temp Source 11/07/22 1352 Oral     SpO2 11/07/22 1352 97 %     Weight --      Height --      Head Circumference --      Peak Flow --      Pain Score 11/07/22 1351 4     Pain Loc --      Pain Edu? --      Excl. in GC? --    No data found.  Updated Vital Signs BP (!) 168/84 (BP Location: Left Arm)   Pulse 86   Temp 98.2 F (36.8 C) (Oral)   Resp 20   SpO2 97%   Visual Acuity Right  Eye Distance:   Left Eye Distance:   Bilateral Distance:    Right Eye Near:   Left Eye Near:    Bilateral Near:     Physical Exam   UC Treatments / Results  Labs (all labs ordered are listed, but only abnormal results are displayed) Labs Reviewed - No data to display  EKG   Radiology No results found.  Procedures Procedures (including critical care time)  Medications Ordered in UC Medications - No data to display  Initial Impression / Assessment and Plan / UC Course  I have reviewed the triage vital signs and the nursing notes.  Pertinent labs & imaging results that were available during my care of the patient were reviewed by me and considered in my medical decision making (see chart for details).   Herpetic rash on left side of his chest measuring 2 cm's by 3 cm's.  No rash is visible on his back.  Very tender to palpation.  Treating for shingles without complication using valacyclovir.  Reviewed chart history.   Counseled patient on potential for adverse effects with medications prescribed/recommended today, ER and return-to-clinic precautions discussed, patient verbalized understanding and agreement with care plan.  Final Clinical Impressions(s) / UC Diagnoses   Final diagnoses:  None   Discharge Instructions   None    ED Prescriptions   None    PDMP not reviewed this encounter.   Charma Igo, [place] 11/07/22 1404

## 2022-11-07 NOTE — ED Triage Notes (Signed)
Patient states he noticed discomfort to upper back, he noticed rash to the area this morning.   Started: 2 weeks ago   Home interventions: alka selter, tylenol

## 2022-11-07 NOTE — Discharge Instructions (Signed)
Follow up with your primary care provider if your symptoms are worsening or not improving.    

## 2022-11-08 ENCOUNTER — Ambulatory Visit: Payer: Medicare HMO

## 2022-11-16 MED ORDER — VALACYCLOVIR HCL 1 G PO TABS: 1000.0000 mg | ORAL_TABLET | Freq: Three times a day (TID) | ORAL | 0 refills | Status: DC

## 2022-12-08 ENCOUNTER — Encounter: Payer: Self-pay | Admitting: Internal Medicine

## 2022-12-08 ENCOUNTER — Ambulatory Visit (INDEPENDENT_AMBULATORY_CARE_PROVIDER_SITE_OTHER): Payer: Medicare HMO | Admitting: Internal Medicine

## 2022-12-08 VITALS — BP 138/80 | HR 77 | Temp 97.8°F | Ht 68.5 in | Wt 276.0 lb

## 2022-12-08 DIAGNOSIS — B029 Zoster without complications: Secondary | ICD-10-CM | POA: Diagnosis not present

## 2022-12-08 DIAGNOSIS — F39 Unspecified mood [affective] disorder: Secondary | ICD-10-CM

## 2022-12-08 NOTE — Assessment & Plan Note (Signed)
Has had some persistent depression--afraid to take any anti-depressant Does get some relief from the alprazolam---since it seems secondary to worrying, etc Has had some panic attacks in the past--the xanax helps for that No suicidal ideation

## 2022-12-08 NOTE — Progress Notes (Signed)
Subjective:    Patient ID: Caleb Arellano, male    DOB: 14-Feb-1951, 72 y.o.   MRN: [id number]  HPI Here for follow up after prolonged bout with shingles  Still has some pain--but is much better Did start the second week of valacyclovir---since the lesions did crust over  No fever Not sick  Current Outpatient Medications on File Prior to Visit  Medication Sig Dispense Refill   ALPRAZolam (XANAX) 0.5 MG tablet Take 1-2 tablets (0.5-1 mg total) by mouth 2 (two) times daily as needed for anxiety. 30 tablet 0   folic acid (FOLVITE) 1 MG tablet TAKE 1 TABLET BY MOUTH EVERY DAY 90 tablet 3   hydrocortisone 2.5 % cream Apply topically 2 (two) times daily as needed. 30 g 1   ketoconazole (NIZORAL) 2 % shampoo Apply 1 application topically 2 (two) times a week. (Patient taking differently: Apply 1 application  topically as needed.) 120 mL 11   meclizine (ANTIVERT) 12.5 MG tablet Take 1 tablet (12.5 mg total) by mouth 3 (three) times daily as needed for dizziness. 90 tablet 0   spironolactone (ALDACTONE) 50 MG tablet TAKE 1 TABLET BY MOUTH EVERY DAY (Patient taking differently: Takes every other day) 90 tablet 3   thiamine (VITAMIN B-1) 100 MG tablet Take 100 mg by mouth daily.      No current facility-administered medications on file prior to visit.    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 No pertinent surgical history.  Family History  Problem Relation Age of Onset   Hypertension Mother    Diabetes Mother    Cancer Sister        1 sister with blood cancer, 1 with breast cancer   Stroke Brother    Heart disease Neg Hx     Social History   Socioeconomic History   Marital status:  Married    Spouse name: Not on file   Number of children: 3   Years of education: Not on file   Highest education level: Not on file  Occupational History   Occupation: disabled  Tobacco Use   Smoking status: Former    Types: Cigarettes    Passive exposure: Never   Smokeless tobacco: Never  Substance and Sexual Activity   Alcohol use: No    Comment: alcoholic, stopped recently   Drug use: No   Sexual activity: Not on file  Other Topics Concern   Not on file  Social History Narrative   No living will   Wife, then daughter Scarlette Calico, would be medical decision maker   Would accept resuscitation   No prolonged tube feeds if cognitively unaware   Social Determinants of Health   Financial Resource Strain: Not on file  Food Insecurity: Not on file  Transportation Needs: Not on file  Physical Activity: Not on file  Stress: Not on file  Social Connections: Not on file  Intimate Partner Violence: Not on file   Review of Systems Did have pain in left back from this---he couldn't drive for a while. Now back to driving/work     Objective:   Physical Exam Skin:    Comments: Several mild hyperpigmented areas along  left T6 (or so) dermatome            Assessment & Plan:

## 2022-12-08 NOTE — Assessment & Plan Note (Signed)
Only residual pain--mostly in back No systemic symptoms  No need for gabapentin

## 2022-12-23 DIAGNOSIS — H900 Conductive hearing loss, bilateral: Secondary | ICD-10-CM | POA: Diagnosis not present

## 2022-12-23 DIAGNOSIS — H6123 Impacted cerumen, bilateral: Secondary | ICD-10-CM | POA: Diagnosis not present

## 2023-01-22 DIAGNOSIS — H900 Conductive hearing loss, bilateral: Secondary | ICD-10-CM | POA: Diagnosis not present

## 2023-01-22 DIAGNOSIS — H6123 Impacted cerumen, bilateral: Secondary | ICD-10-CM | POA: Diagnosis not present

## 2023-02-08 ENCOUNTER — Other Ambulatory Visit: Payer: Self-pay | Admitting: Internal Medicine

## 2023-02-08 MED ORDER — ALPRAZOLAM 0.5 MG PO TABS: 0.5000 mg | ORAL_TABLET | Freq: Two times a day (BID) | ORAL | 0 refills | Status: DC | PRN

## 2023-02-08 NOTE — Telephone Encounter (Signed)
Last filled 11-05-22 #30 Last OV 12-08-22 Next OV 03-10-23 [facility]

## 2023-02-11 ENCOUNTER — Encounter (INDEPENDENT_AMBULATORY_CARE_PROVIDER_SITE_OTHER): Payer: Self-pay

## 2023-02-28 IMAGING — CT CT TEMPORAL BONES W/O CM
3 of 6 series · 15 of 40 positions shown, 18 images · non-contrast
Comparison: No pertinent prior exam.

CLINICAL DATA: Infective otitis externa, right

EXAM:
CT TEMPORAL BONES WITHOUT CONTRAST
TECHNIQUE: Axial and coronal plane CT imaging of the petrous temporal bones was
performed with thin-collimation image reconstruction. No intravenous
contrast was administered. Multiplanar CT image reconstructions were
also generated.
RADIATION DOSE REDUCTION: This exam was performed according to the
departmental dose-optimization program which includes automated
exposure control, adjustment of the mA and/or kV according to
patient size and/or use of iterative reconstruction technique.

[Series 3: ax soft · axial · 0.33mm/px · z∈[-56,-18]mm · 3 of 48 slices shown]
[im 10/48  brain]
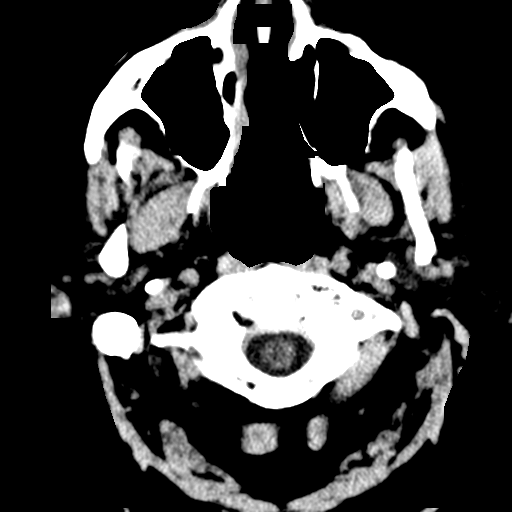
[im 19/48  brain]
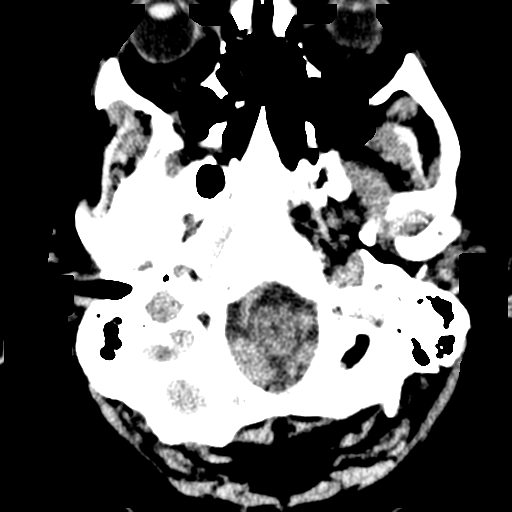
[im 29/48  brain]
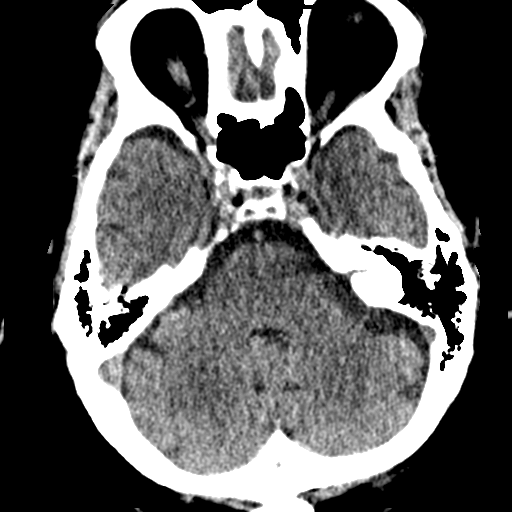

[Series 5: ax mag right · axial · 0.20mm/px · z∈[-52,-3]mm · 10 of 102 slices shown, 13 images]
[im 10/102  brain]
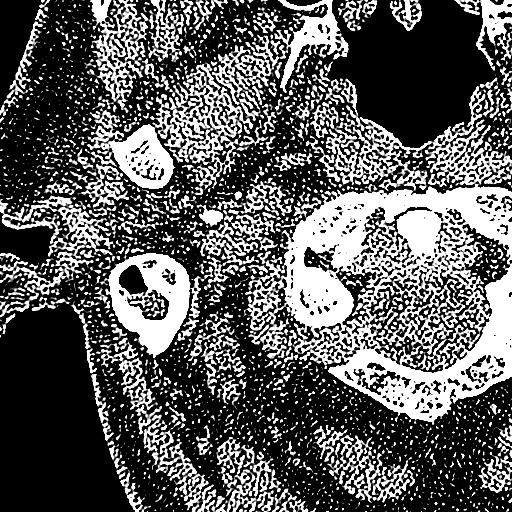
[im 10/102  bone]
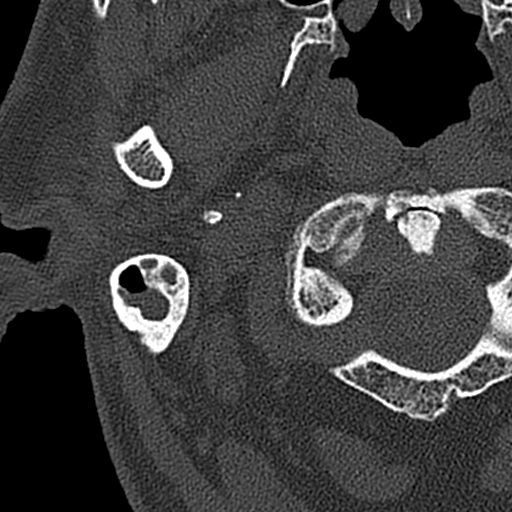
[im 19/102  bone]
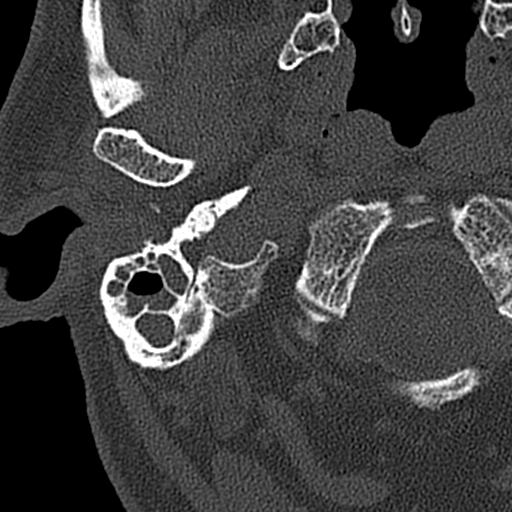
[im 28/102  bone]
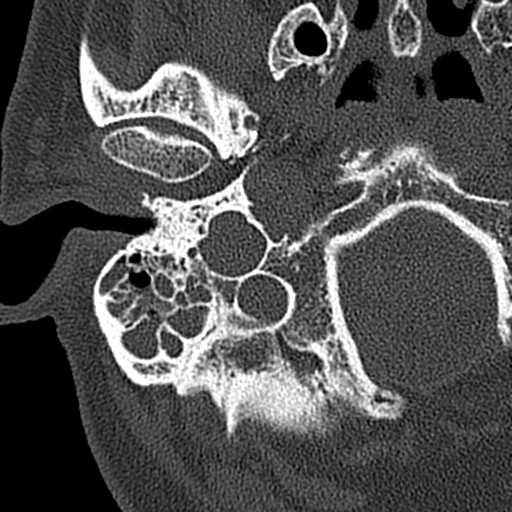
[im 37/102  bone]
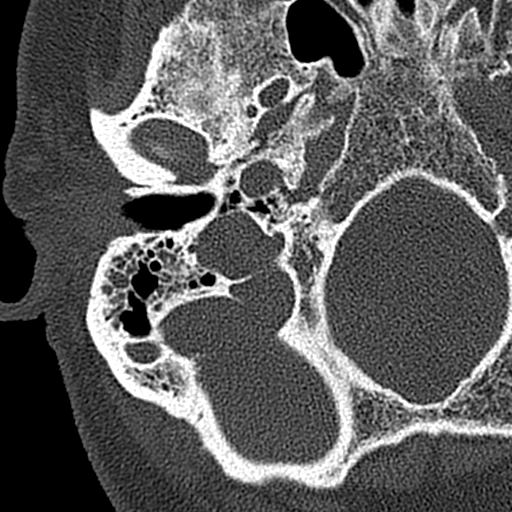
[im 46/102  brain]
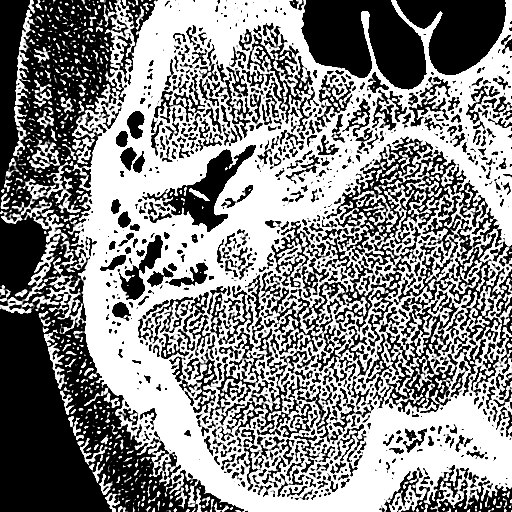
[im 46/102  bone]
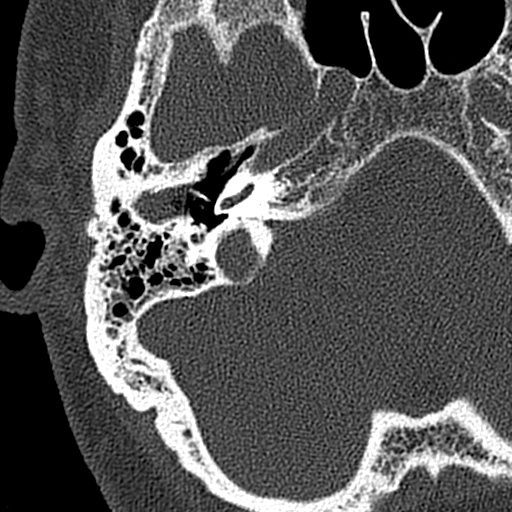
[im 56/102  bone]
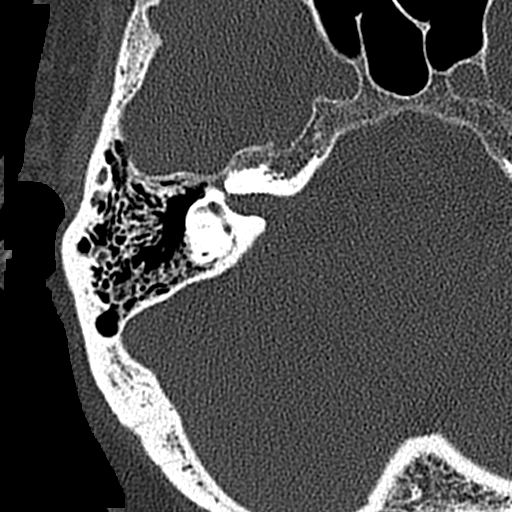
[im 65/102  bone]
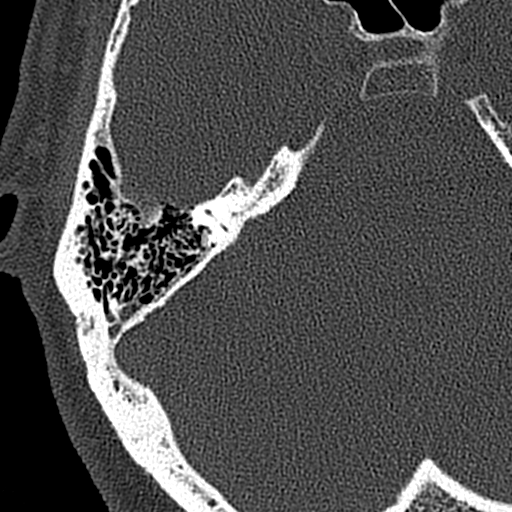
[im 74/102  bone]
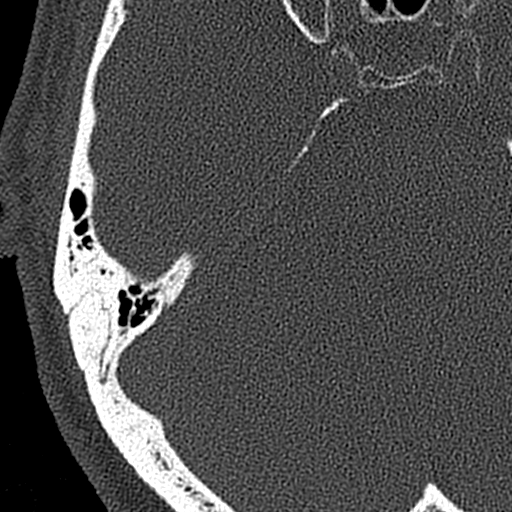
[im 83/102  brain]
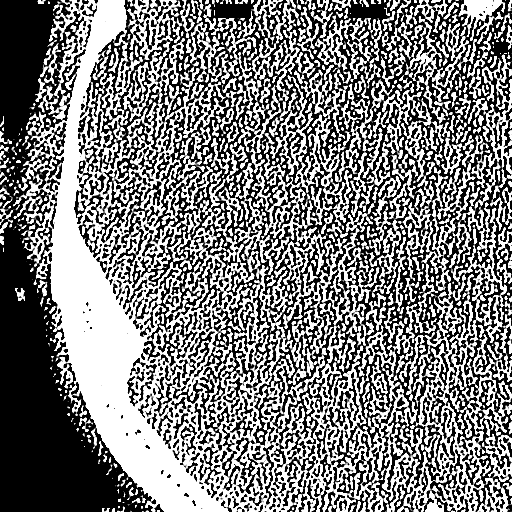
[im 83/102  bone]
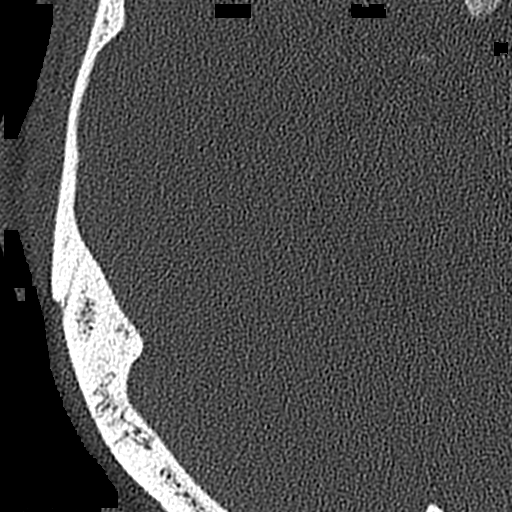
[im 92/102  bone]
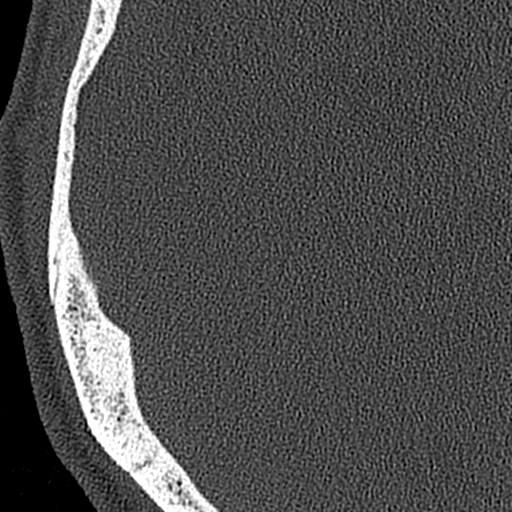

[Series 7: cor mag right · coronal · 0.20mm/px · 2 of 167 slices shown]
[im 56/167  bone]
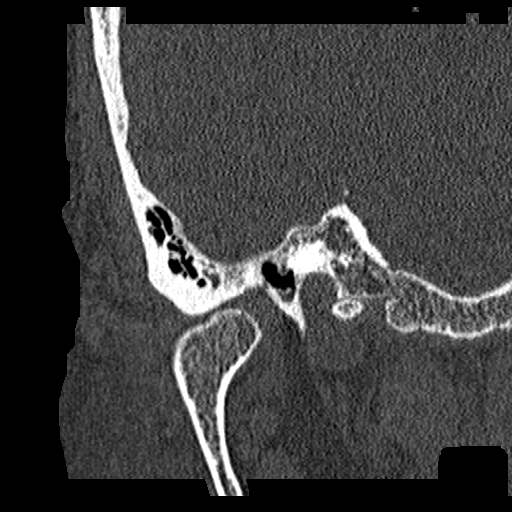
[im 111/167  bone]
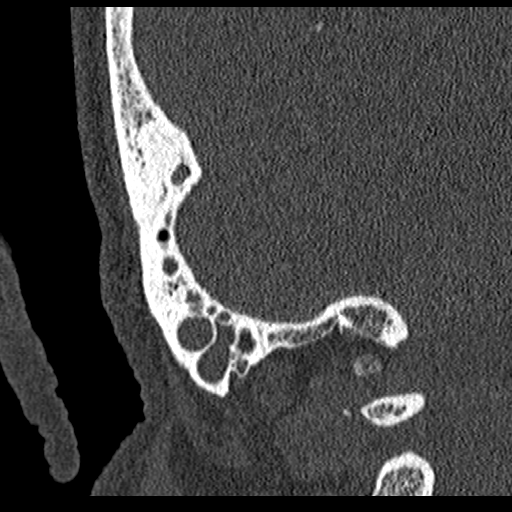

[15 of 40 positions shown; findings below may reference images not displayed]

FINDINGS: RIGHT TEMPORAL BONE

External auditory canal: Soft tissue thickening primarily along the
roof the external auditory canal may reflect cerumen or inflammatory
debris given history. No erosive changes. There is no thickening of
the tympanic membrane.

Middle ear cavity: Aerated.  Ossicles are intact.

Inner ear structures: The cochlea, vestibule and semicircular canals
are normal. The vestibular aqueduct is not enlarged.

Internal auditory and facial nerve canals: Unremarkable. Facial
nerve has a normal course.

Mastoid air cells: Patchy opacification primarily at the tip. No
erosive changes.

LEFT TEMPORAL BONE

External auditory canal: Aerated. No thickening of the tympanic
membrane.

Middle ear cavity: Aerated.  Ossicles are intact.

Inner ear structures: The cochlea, vestibule and semicircular canals
are normal. The vestibular aqueduct is not enlarged.

Internal auditory and facial nerve canals: Unremarkable. Facial
nerve has a normal course.

Mastoid air cells: Aerated.

Vascular: Normal non-contrast appearance of the carotid canals,
jugular bulbs and sigmoid plates.

Limited intracranial:  No acute abnormality.

Visible orbits/paranasal sinuses: Orbits are unremarkable. Minor
paranasal sinus mucosal thickening.

Soft tissues: Unremarkable.
IMPRESSION: Mild asymmetric soft tissue thickening primarily along the roof the
right external auditory canal. This may reflect cerumen or
inflammatory debris given history. The middle ear cleft is clear.
There is minor right mastoid opacification. There are no erosive
changes.

Unremarkable left temporal bone.

## 2023-03-08 ENCOUNTER — Other Ambulatory Visit: Payer: Self-pay | Admitting: Internal Medicine

## 2023-03-08 DIAGNOSIS — H6123 Impacted cerumen, bilateral: Secondary | ICD-10-CM | POA: Diagnosis not present

## 2023-03-08 DIAGNOSIS — H900 Conductive hearing loss, bilateral: Secondary | ICD-10-CM | POA: Diagnosis not present

## 2023-03-08 MED ORDER — ALPRAZOLAM 0.5 MG PO TABS: 0.5000 mg | ORAL_TABLET | Freq: Two times a day (BID) | ORAL | 0 refills | Status: DC | PRN

## 2023-03-10 ENCOUNTER — Ambulatory Visit: Payer: Medicare HMO | Admitting: Internal Medicine

## 2023-03-10 ENCOUNTER — Encounter: Payer: Self-pay | Admitting: Internal Medicine

## 2023-03-10 ENCOUNTER — Encounter: Payer: Self-pay | Admitting: *Deleted

## 2023-03-10 VITALS — BP 136/70 | HR 102 | Temp 98.5°F | Ht 68.5 in | Wt 272.0 lb

## 2023-03-10 DIAGNOSIS — Z0001 Encounter for general adult medical examination with abnormal findings: Secondary | ICD-10-CM | POA: Diagnosis not present

## 2023-03-10 DIAGNOSIS — L570 Actinic keratosis: Secondary | ICD-10-CM

## 2023-03-10 DIAGNOSIS — Z23 Encounter for immunization: Secondary | ICD-10-CM

## 2023-03-10 DIAGNOSIS — F39 Unspecified mood [affective] disorder: Secondary | ICD-10-CM | POA: Diagnosis not present

## 2023-03-10 DIAGNOSIS — K7030 Alcoholic cirrhosis of liver without ascites: Secondary | ICD-10-CM

## 2023-03-10 DIAGNOSIS — F1021 Alcohol dependence, in remission: Secondary | ICD-10-CM | POA: Diagnosis not present

## 2023-03-10 DIAGNOSIS — Z0000 Encounter for general adult medical examination without abnormal findings: Secondary | ICD-10-CM

## 2023-03-10 DIAGNOSIS — I10 Essential (primary) hypertension: Secondary | ICD-10-CM | POA: Diagnosis not present

## 2023-03-10 DIAGNOSIS — G621 Alcoholic polyneuropathy: Secondary | ICD-10-CM | POA: Diagnosis not present

## 2023-03-10 LAB — HEPATIC FUNCTION PANEL
ALT: 22 U/L (ref 0–53)
AST: 20 U/L (ref 0–37)
Albumin: 4.4 g/dL (ref 3.5–5.2)
Alkaline Phosphatase: 75 U/L (ref 39–117)
Bilirubin, Direct: 0.1 mg/dL (ref 0.0–0.3)
Total Bilirubin: 0.5 mg/dL (ref 0.2–1.2)
Total Protein: 7.6 g/dL (ref 6.0–8.3)

## 2023-03-10 LAB — RENAL FUNCTION PANEL
Albumin: 4.4 g/dL (ref 3.5–5.2)
BUN: 9 mg/dL (ref 6–23)
CO2: 27 meq/L (ref 19–32)
Calcium: 9.6 mg/dL (ref 8.4–10.5)
Chloride: 101 meq/L (ref 96–112)
Creatinine, Ser: 0.9 mg/dL (ref 0.40–1.50)
GFR: 85.33 mL/min (ref >60.00)
Glucose, Bld: 103 mg/dL — ABNORMAL HIGH (ref 70–99)
Phosphorus: 2.3 mg/dL (ref 2.3–4.6)
Potassium: 4 meq/L (ref 3.5–5.1)
Sodium: 136 meq/L (ref 135–145)

## 2023-03-10 LAB — CBC
HCT: 49 % (ref 39.0–52.0)
Hemoglobin: 16.1 g/dL (ref 13.0–17.0)
MCHC: 32.9 g/dL (ref 30.0–36.0)
MCV: 93.8 fl (ref 78.0–100.0)
Platelets: 257 10*3/uL (ref 150.0–400.0)
RBC: 5.22 Mil/uL (ref 4.22–5.81)
RDW: 13.2 % (ref 11.5–15.5)
WBC: 8.2 10*3/uL (ref 4.0–10.5)

## 2023-03-10 NOTE — Assessment & Plan Note (Signed)
BP Readings from Last 3 Encounters:  03/10/23 136/70  12/08/22 138/80  11/07/22 (!) 168/84   Has been fine Not taking the sprionolactone regularly

## 2023-03-10 NOTE — Assessment & Plan Note (Signed)
Verbal consent Liquid nitrogen 25 seconds x 2  Tolerated well Discussed home Rx To derm if recurs

## 2023-03-10 NOTE — Assessment & Plan Note (Signed)
Thinks he has lost some weight Discussed lifestyle

## 2023-03-10 NOTE — Progress Notes (Signed)
Subjective:    Patient ID: Caleb Arellano, male    DOB: 08/19/1950, 72 y.o.   MRN: [id number]  HPI Here for Medicare wellness visit and follow up of chronic health conditions Reviewed advanced directives Reviewed other doctors---Ms Uhlenhake PA/ [place]--ENT, Dr Collier [place], [facility] No hospitalizations or surgery in the past year Vision is okay Considering hearing aides No alcohol No tobacco Not really exercising Several minor falls---no injuries. 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 30 tablet 0   folic acid (FOLVITE) 1 MG tablet TAKE 1 TABLET BY MOUTH EVERY DAY 90 tablet 3   hydrocortisone 2.5 % cream Apply topically 2 (two) times daily as needed. 30 g 1   ketoconazole (NIZORAL) 2 % shampoo Apply 1 application topically 2 (two) times a week. (Patient taking differently: Apply 1 application  topically as needed.) 120 mL 11   meclizine (ANTIVERT) 12.5 MG tablet Take 1 tablet (12.5 mg total) by mouth 3 (three) times daily as needed for dizziness. 90 tablet 0   spironolactone (ALDACTONE) 50 MG tablet TAKE 1  TABLET BY MOUTH EVERY DAY (Patient taking differently: Takes every other day) 90 tablet 3   thiamine (VITAMIN B-1) 100 MG tablet Take 100 mg by mouth daily.      No current facility-administered medications on file prior to visit.    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 No pertinent surgical history.  Family History  Problem Relation Age of Onset   Hypertension Mother    Diabetes Mother    Cancer Sister        1 sister with blood cancer, 1 with breast cancer   Stroke Brother    Heart disease Neg Hx     Social History   Socioeconomic History   Marital status: Married    Spouse name: Not on file   Number of children: 3   Years of education: Not on file   Highest education level: Not on file  Occupational History   Occupation: disabled  Tobacco Use   Smoking status: Former    Types: Cigarettes    Passive exposure: Never   Smokeless tobacco: Never  Substance and Sexual Activity   Alcohol use: No    Comment: alcoholic, stopped recently   Drug use: No   Sexual activity: Not on file  Other Topics Concern  Not on file  Social History Narrative   No living will   Wife, then daughter Scarlette Calico, would be medical decision maker   Would accept resuscitation   No prolonged tube feeds if cognitively unaware   Social Determinants of Health   Financial Resource Strain: Not on file  Food Insecurity: Not on file  Transportation Needs: Not on file  Physical Activity: Not on file  Stress: Not on file  Social Connections: Not on file  Intimate Partner Violence: Not on file   Review of Systems Wears seat belt Teeth not so good--had some extractions and getting a partial No  heartburn or dysphagia Bowels move okay Some back pain---no Rx. Discussed trying heating pad Voids okay--some urgency and hesitancy in AM. Fine after Has spot on back (itchy) and right flank to be checked    Objective:   Physical Exam Constitutional:      Appearance: Normal appearance. He is obese.  HENT:     Mouth/Throat:     Pharynx: No oropharyngeal exudate or posterior oropharyngeal erythema.  Eyes:     Conjunctiva/sclera: Conjunctivae normal.     Pupils: Pupils are equal, round, and reactive to light.  Cardiovascular:     Rate and Rhythm: Normal rate and regular rhythm.     Pulses: Normal pulses.     Heart sounds: No murmur heard.    No gallop.  Pulmonary:     Effort: Pulmonary effort is normal.     Breath sounds: Normal breath sounds. No wheezing or rales.  Abdominal:     Palpations: Abdomen is soft.     Tenderness: There is no abdominal tenderness.  Musculoskeletal:     Cervical back: Neck supple.     Right lower leg: No edema.     Left lower leg: No edema.  Lymphadenopathy:     Cervical: No cervical adenopathy.  Skin:    Findings: No lesion or rash.     Comments: 4-5,mm crusted inflamed lesion in right mid back Wart left hand also---62mm  Neurological:     General: No focal deficit present.     Mental Status: He is alert and oriented to person, place, and time.     Comments: Did okay with cognitive testing  Psychiatric:        Mood and Affect: Mood normal.        Behavior: Behavior normal.            Assessment & Plan:

## 2023-03-10 NOTE — Assessment & Plan Note (Signed)
Increasing reactive changes--with illnesses in wife and sister Daily anxiety Some dysthymia Alprazolam most days--discussed daily Rx (like sertraline 25)--but will hold off unless things worsen

## 2023-03-10 NOTE — Assessment & Plan Note (Signed)
Fortunately remains abstinent--despite mood issues

## 2023-03-10 NOTE — Assessment & Plan Note (Signed)
This has been stable On thiamine

## 2023-03-10 NOTE — Patient Instructions (Addendum)
Please get a tetanus booster (Td) and COVID vaccine at the pharmacy. I would also recommend a one time RSV vaccine Consider shingrix as well Let me know if things worsen with your mood

## 2023-03-10 NOTE — Assessment & Plan Note (Signed)
I have personally reviewed the Medicare Annual Wellness questionnaire and have noted 1. The patient's medical and social history 2. Their use of alcohol, tobacco or illicit drugs 3. Their current medications and supplements 4. The patient's functional ability including ADL's, fall risks, home safety risks and hearing or visual             impairment. 5. Diet and physical activities 6. Evidence for depression or mood disorders  The patients weight, height, BMI and visual acuity have been recorded in the chart I have made referrals, counseling and provided education to the patient based review of the above and I have provided the pt with a written personalized care plan for preventive services.  I have provided you with a copy of your personalized plan for preventive services. Please take the time to review along with your updated medication list.  Discussed activity Flu vaccine today Needs Td, COVID booster and RSV Doesn't want colon or prostate cancer screening

## 2023-03-10 NOTE — Assessment & Plan Note (Signed)
Seems compensated No fluid overload Will check ultrasound for hepatoma screening

## 2023-03-19 ENCOUNTER — Encounter: Payer: Self-pay | Admitting: Internal Medicine

## 2023-03-19 DIAGNOSIS — F39 Unspecified mood [affective] disorder: Secondary | ICD-10-CM

## 2023-03-19 MED ORDER — SERTRALINE HCL 25 MG PO TABS: 25.0000 mg | ORAL_TABLET | Freq: Every day | ORAL | 1 refills | Status: DC

## 2023-03-24 ENCOUNTER — Emergency Department
Admission: EM | Admit: 2023-03-24 | Discharge: 2023-03-24 | Disposition: A | Discharge status: Home / Self Care | Payer: Medicare HMO | Attending: Student in an Organized Health Care Education/Training Program | Admitting: Student in an Organized Health Care Education/Training Program

## 2023-03-24 ENCOUNTER — Other Ambulatory Visit: Payer: Self-pay

## 2023-03-24 ENCOUNTER — Encounter: Payer: Self-pay | Admitting: *Deleted

## 2023-03-24 DIAGNOSIS — F1021 Alcohol dependence, in remission: Secondary | ICD-10-CM | POA: Diagnosis not present

## 2023-03-24 DIAGNOSIS — F419 Anxiety disorder, unspecified: Secondary | ICD-10-CM | POA: Diagnosis not present

## 2023-03-24 DIAGNOSIS — F410 Panic disorder [episodic paroxysmal anxiety] without agoraphobia: Secondary | ICD-10-CM | POA: Diagnosis not present

## 2023-03-24 DIAGNOSIS — R55 Syncope and collapse: Secondary | ICD-10-CM | POA: Diagnosis not present

## 2023-03-24 DIAGNOSIS — R7989 Other specified abnormal findings of blood chemistry: Secondary | ICD-10-CM | POA: Insufficient documentation

## 2023-03-24 DIAGNOSIS — R000 Tachycardia, unspecified: Secondary | ICD-10-CM | POA: Diagnosis not present

## 2023-03-24 LAB — URINALYSIS, ROUTINE W REFLEX MICROSCOPIC
Bilirubin Urine: NEGATIVE
Glucose, UA: NEGATIVE mg/dL
Hgb urine dipstick: NEGATIVE
Ketones, ur: NEGATIVE mg/dL
Leukocytes,Ua: NEGATIVE
Nitrite: NEGATIVE
Protein, ur: NEGATIVE mg/dL
Specific Gravity, Urine: 1.012 (ref 1.005–1.030)
pH: 6 (ref 5.0–8.0)

## 2023-03-24 LAB — BASIC METABOLIC PANEL
Anion gap: 10 (ref 5–15)
BUN: 9 mg/dL (ref 8–23)
CO2: 21 mmol/L — ABNORMAL LOW (ref 22–32)
Calcium: 9 mg/dL (ref 8.9–10.3)
Chloride: 102 mmol/L (ref 98–111)
Creatinine, Ser: 0.86 mg/dL (ref 0.61–1.24)
GFR, Estimated: >60 mL/min (ref >60)
Glucose, Bld: 153 mg/dL — ABNORMAL HIGH (ref 70–99)
Potassium: 3.5 mmol/L (ref 3.5–5.1)
Sodium: 133 mmol/L — ABNORMAL LOW (ref 135–145)

## 2023-03-24 LAB — CBC
HCT: 46.8 % (ref 39.0–52.0)
Hemoglobin: 15.7 g/dL (ref 13.0–17.0)
MCH: 30.7 pg (ref 26.0–34.0)
MCHC: 33.5 g/dL (ref 30.0–36.0)
MCV: 91.6 fL (ref 80.0–100.0)
Platelets: 234 10*3/uL (ref 150–400)
RBC: 5.11 MIL/uL (ref 4.22–5.81)
RDW: 12.2 % (ref 11.5–15.5)
WBC: 7.8 10*3/uL (ref 4.0–10.5)
nRBC: 0 % (ref 0.0–0.2)

## 2023-03-24 MED ORDER — DIAZEPAM 2 MG PO TABS
2.0000 mg | ORAL_TABLET | Freq: Once | ORAL | Status: AC
  Administered 2023-03-24: 2 mg via ORAL
  Filled 2023-03-24: qty 1

## 2023-03-24 NOTE — ED Triage Notes (Addendum)
Pt states that he had a little black out.  He states that "the world went dark for a little bit and I felt a little rush".  No CP with this.  Pt has hx of this.  Pt reports that he is on alprazolam for this but has not been taking full dose as it makes him sleepy
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
## 2023-03-24 NOTE — ED Provider Notes (Signed)
[facility] Provider Note    Event Date/Time   First MD Initiated Contact with Patient 03/24/23 1528     (approximate)   History   Near [place]ope   HPI  Caleb Arellano is a 72 y.o. male presents to the ER for evaluation of several weeks of worsening severe anxiety now debilitating.  Having frequent daily panic attacks having to take multiple doses of Xanax without any improvement.  Was instructed by PCP to start Zoloft but took that yesterday and had severe panic attack shaking reaction and is unwilling to continue.  States he feels helpless.  No recent fevers or trauma no specific inciting event.  Feels when these episodes happen that he is stuck and cannot move.     Physical Exam   Triage Vital Signs: ED Triage Vitals  Encounter Vitals Group     BP 03/24/23 1416 (!) 167/84     Systolic BP Percentile --      Diastolic BP Percentile --      Pulse Rate 03/24/23 1416 (!) 102     Resp 03/24/23 1416 18     Temp 03/24/23 1416 98.1 F (36.7 C)     Temp Source 03/24/23 1416 Oral     SpO2 03/24/23 1416 98 %     Weight --      Height --      Head Circumference --      Peak Flow --      Pain Score 03/24/23 1417 0     Pain Loc --      Pain Education --      Exclude from Growth Chart --     Most recent vital signs: Vitals:   03/24/23 1416  BP: (!) 167/84  Pulse: (!) 102  Resp: 18  Temp: 98.1 F (36.7 C)  SpO2: 98%     Constitutional: Alert  Eyes: Conjunctivae are normal.  Head: Atraumatic. Nose: No congestion/rhinnorhea. Mouth/Throat: Mucous membranes are moist.   Neck: Painless ROM.  Cardiovascular:   Good peripheral circulation. Respiratory: Normal respiratory effort.  No retractions.  Gastrointestinal: Soft and nontender.  Musculoskeletal:  no deformity Neurologic:  MAE spontaneously. No gross focal neurologic deficits are appreciated.  Skin:  Skin is warm, dry and intact. No rash noted. Psychiatric: Mood and affect are anxious, Speech  and behavior are normal.    ED Results / Procedures / Treatments   Labs (all labs ordered are listed, but only abnormal results are displayed) Labs Reviewed  BASIC METABOLIC PANEL - Abnormal; Notable for the following components:      Result Value   Sodium 133 (*)    CO2 21 (*)    Glucose, Bld 153 (*)    All other components within normal limits  URINALYSIS, ROUTINE W REFLEX MICROSCOPIC - Abnormal; Notable for the following components:   Color, Urine YELLOW (*)    APPearance CLEAR (*)    All other components within normal limits  CBC  CBG MONITORING, ED     EKG  ED ECG REPORT I, Willy Eddy, the attending physician, personally viewed and interpreted this ECG.   Date: 03/24/2023  EKG Time: 14:18  Rate: 100  Rhythm: sinus  Axis: normal  Intervals: normal  ST&T Change: no stmei, no depressions    RADIOLOGY Please see ED Course for my review and interpretation.  I personally reviewed all radiographic images ordered to evaluate for the above acute complaints and reviewed radiology reports and findings.  These findings were  personally discussed with the patient.  Please see medical record for radiology report.    PROCEDURES:  Critical Care performed: No  Procedures   MEDICATIONS ORDERED IN ED: Medications  diazepam (VALIUM) tablet 2 mg (2 mg Oral Given 03/24/23 1608)     IMPRESSION / MDM / ASSESSMENT AND PLAN / ED COURSE  I reviewed the triage vital signs and the nursing notes.                              Differential diagnosis includes, but is not limited to, electrolyte abnormality, dysrhythmia, dehydration, anxiety, depression, medication reaction  Patient presenting to the ER for evaluation of symptoms as described above.  Based on symptoms, risk factors and considered above differential, this presenting complaint could reflect a potentially life-threatening illness therefore the patient will be placed on continuous pulse oximetry and telemetry for  monitoring.  Laboratory evaluation will be sent to evaluate for the above complaints.  Patient clinically appears very anxious no objective findings on physical exam.  No SI or HI.  EKG nonischemic denies any chest pain or pressure not consistent with ACS.  Do suspect his presentation is worsening debilitating anxiety with limited outpatient follow-up options.  Will consult psychiatry.   Clinical Course as of 03/24/23 1703  Wed Mar 24, 2023  1700 Patient has been evaluated by psychiatry at bedside.  No recommendations for initiation of additional medications or inpatient management.  Recommending resources and referral for outpatient psychiatry. [PR]    Clinical Course User Index [PR] Willy Eddy, MD     FINAL CLINICAL IMPRESSION(S) / ED DIAGNOSES   Final diagnoses:  Anxiety     Rx / DC Orders   ED Discharge Orders     None        Note:  This document was prepared using Dragon voice recognition software and may include unintentional dictation errors.    Willy Eddy, MD 03/24/23 [id number]

## 2023-03-29 ENCOUNTER — Encounter: Payer: Self-pay | Admitting: Internal Medicine

## 2023-03-29 ENCOUNTER — Ambulatory Visit (INDEPENDENT_AMBULATORY_CARE_PROVIDER_SITE_OTHER): Payer: Medicare HMO | Admitting: Internal Medicine

## 2023-03-29 VITALS — BP 138/80 | HR 80 | Temp 98.2°F | Ht 68.5 in | Wt 267.0 lb

## 2023-03-29 DIAGNOSIS — F39 Unspecified mood [affective] disorder: Secondary | ICD-10-CM | POA: Diagnosis not present

## 2023-03-29 MED ORDER — ALPRAZOLAM 0.5 MG PO TABS: 0.5000 mg | ORAL_TABLET | Freq: Two times a day (BID) | ORAL | 0 refills | Status: DC | PRN

## 2023-03-29 NOTE — Progress Notes (Signed)
Subjective:    Patient ID: Caleb Arellano, male    DOB: Nov 20, 1950, 72 y.o.   MRN: [id number]  HPI Here for ER follow up  Did try sertraline---"it hit like a ton of bricks--and gave me the jitters" Then felt worse--and went to ER (panic and feeling like "something bad was going to happen") Got valium and it helped  Made him slightly sleepy--but not much  Since then, doing okay if taking the alprazolam regularly Drowsy with the 0.5--will take 1/2  Noted anxiety attacks "forever"--but now trouble "dealing with it  Current Outpatient Medications on File Prior to Visit  Medication Sig Dispense Refill   ALPRAZolam (XANAX) 0.5 MG tablet Take 1-2 tablets (0.5-1 mg total) by mouth 2 (two) times daily as needed for anxiety. 30 tablet 0   folic acid (FOLVITE) 1 MG tablet TAKE 1 TABLET BY MOUTH EVERY DAY 90 tablet 3   hydrocortisone 2.5 % cream Apply topically 2 (two) times daily as needed. 30 g 1   ketoconazole (NIZORAL) 2 % shampoo Apply 1 application topically 2 (two) times a week. (Patient taking differently: Apply 1 application  topically as needed.) 120 mL 11   meclizine (ANTIVERT) 12.5 MG tablet Take 1 tablet (12.5 mg total) by mouth 3 (three) times daily as needed for dizziness. 90 tablet 0   spironolactone (ALDACTONE) 50 MG tablet TAKE 1 TABLET BY MOUTH EVERY DAY (Patient taking differently: Takes every other day) 90 tablet 3   thiamine (VITAMIN B-1) 100 MG tablet Take 100 mg by mouth daily.      sertraline (ZOLOFT) 25 MG tablet Take 1 tablet (25 mg total) by mouth daily. (Patient not taking: Reported on 03/29/2023) 30 tablet 1   No current facility-administered medications on file prior to visit.    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 No pertinent surgical history.  Family History  Problem Relation Age of Onset   Hypertension Mother    Diabetes Mother    Cancer Sister        1 sister with blood cancer, 1 with breast cancer   Stroke Brother    Heart disease Neg Hx     Social History   Socioeconomic History   Marital status: Married    Spouse name: Not on file   Number of children: 3   Years of education: Not on file   Highest education level: Not on file  Occupational History   Occupation: disabled  Tobacco Use   Smoking status: Former    Types: Cigarettes    Passive exposure: Never   Smokeless tobacco: Never  Substance and Sexual Activity   Alcohol use: No    Comment: alcoholic, stopped recently   Drug use: No   Sexual activity: Not on file  Other Topics Concern   Not on file  Social History Narrative   No living will   Wife, then daughter Scarlette Calico, would be medical decision maker   Would accept resuscitation   No prolonged tube feeds if cognitively unaware   Social Determinants of Health   Financial Resource Strain: Not on file  Food Insecurity: Not on file  Transportation Needs: Not on file  Physical Activity: Not on file  Stress: Not on file  Social Connections: Not on file  Intimate Partner Violence: Not on file   Review of Systems Has been using the xanax most nights Takes 1/2 in the day     Objective:   Physical Exam         Assessment & Plan:

## 2023-03-29 NOTE — Assessment & Plan Note (Signed)
Didn't tolerate SSRI Looking into counseling--but really prefers someone in person (would have to go to [facility] for this with Kaufman) Discussed trying longer acting benzodiazepine--but he feels comfortable with the xanax for now

## 2023-03-31 ENCOUNTER — Ambulatory Visit: Payer: Medicare HMO | Admitting: Psychology

## 2023-03-31 DIAGNOSIS — F418 Other specified anxiety disorders: Secondary | ICD-10-CM

## 2023-03-31 NOTE — Progress Notes (Unsigned)
Comprehensive Clinical Assessment (CCA) Note  03/31/2023 Caleb Arellano [id number]  Time Spent: 11:06  am - 12:01  pm: 55 Minutes  Chief Complaint: No chief complaint on file.  Visit Diagnosis: Anxiety   Guardian/Payee:  Self    Paperwork requested: No   Reason for Visit /Presenting Problem: Anxiety and panic.   Mental Status Exam: Appearance:   Casual     Behavior:  Appropriate  Motor:  Normal  Speech/Language:   Clear and Coherent  Affect:  Congruent  Mood:  normal  Thought process:  normal  Thought content:    WNL  Sensory/Perceptual disturbances:    WNL  Orientation:  oriented to person, place, time/date, and situation  Attention:  Good  Concentration:  Good  Memory:  WNL  Fund of knowledge:   Good  Insight:    Good  Judgment:   Good  Impulse Control:  Good   Reported Symptoms:  Anxiety and panic.   Risk Assessment: Danger to Self:  No Self-injurious Behavior: No Danger to Others: No Duty to Warn:no Physical Aggression / Violence:No  Access to Firearms a concern: No  Gang Involvement:No  Patient / guardian was educated about steps to take if suicide or homicide risk level increases between visits: no While future psychiatric events cannot be accurately predicted, the patient does not currently require acute inpatient psychiatric care and does not currently meet [facility] involuntary commitment criteria.  Substance Abuse History: Current substance abuse: No     Caffeine: 1x soda or 1x coffee, 1-2x per week.  Tobacco: denied. (Previous smoker).  Alcohol: denied (2007), has cirrhosis of the liver. - hx of alcohol dependence noted via chart.  Substance use: denied.  Past Psychiatric History:   Previous psychological history is significant for anxiety (panic) since high school.  Outpatient Providers: NA History of Psych Hospitalization: No  Psychological Testing:  NA    Caleb Arellano is unaware of any family history of mental health concerns. He noted his  father "getting mean" when taking prescribed an anti-depressant. He noted his father having anger issues. Father had a history of alcohol use.   Abuse History:  Victim of: No.,  na    Report needed: No. Victim of Neglect:No. Perpetrator of  na   Witness / Exposure to Domestic Violence: No   Protective Services Involvement: No  Witness to [employer] Violence:  No   Family History:  Family History  Problem Relation Age of Onset   Hypertension Mother    Diabetes Mother    Cancer Sister        1 sister with blood cancer, 1 with breast cancer   Stroke Brother    Heart disease Neg Hx     Living situation: the patient lives with their spouse  Sexual Orientation: Straight  Relationship Status: married  Name of spouse / other: Leta Jungling (42 years)  If a parent, number of children / ages: Thelma Barge (Cammie) - 50, [place] and [place]  (not much contact).   Support Systems: "I have never had anybody".   Financial Stress:  Yes , Orvis Brill and wife are retired and on [employer]. He noted that its often "close".   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   Military Service: Yes , [employer], drafted.   Educational History: Education: high school diploma/GED  Religion/Sprituality/World View: Methodist. 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  Stressors: Other: Worry about panicking, worrying about his wife.     Strengths: Supportive Relationships, Hopefulness, Self Advocate, and Able to Communicate Effectively  Barriers:  mood.    Legal History: Pending legal issue / charges: The patient has no significant history of legal issues. 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  Medications: Current Outpatient Medications  Medication Sig Dispense Refill   ALPRAZolam (XANAX) 0.5 MG tablet Take 1-2 tablets (0.5-1 mg total) by mouth 2 (two) times daily as needed for anxiety. 30 tablet 0   folic acid (FOLVITE) 1 MG tablet TAKE 1 TABLET BY MOUTH EVERY DAY 90 tablet 3   hydrocortisone 2.5 % cream Apply topically 2 (two) times daily as needed. 30 g 1   ketoconazole (NIZORAL) 2 % shampoo Apply 1 application topically 2 (two) times a week. (Patient taking differently: Apply 1 application  topically as needed.) 120 mL 11   meclizine (ANTIVERT) 12.5 MG tablet Take 1 tablet (12.5 mg total) by mouth 3 (three) times daily as needed for dizziness. 90 tablet 0   sertraline (ZOLOFT) 25 MG tablet Take 1 tablet (25 mg total) by mouth daily. (Patient not taking: Reported on 03/29/2023) 30 tablet 1   spironolactone (ALDACTONE) 50 MG tablet TAKE 1 TABLET BY MOUTH EVERY DAY (Patient taking differently: Takes every other day) 90 tablet 3   thiamine (VITAMIN B-1) 100 MG tablet Take 100 mg by mouth daily.      No current facility-administered medications for this visit.    No Known Allergies  Diagnoses:  Other specified anxiety disorders  Psychiatric Treatment: Yes , via PCP.   Plan of Care: Outpatient Therapy.   Narrative:   Caleb Arellano participated from home, via video, is aware of tele-sessions limitations, and consented to treatment. Therapist participated from office. We reviewed the limits of confidentiality prior to the start of the evaluation. Caleb Arellano expressed understanding and agreement to proceed. Caleb Arellano was referred for counseling by his PCP. Caleb Arellano noted a history of panic attacks over the years . He noted his symptoms initially  beginning in teen years but being quite infrequent. He noted a ramp-up in the past 8 years and noted this being more unmanageable during the past 12 months. He noted that he can't seem to "shake it". He noted taking Xanax to aid in managing his symptoms. He noted recently being prescribed Zoloft and reported this worsening his symptoms and noted going to the ER due to the severity of his symptoms. 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 He endorsed loss of interest in activities due to worry regarding panic. Caleb Arellano noted recent health struggles (ear infections and shingles), the loss of two brothers, and his wife's "silent heart attack". Additional stressors including financial and noted not having "retirement" to help with finances or when the need additional help. He endorsed anticipatory anxiety and noted self-talk including "what's wrong with me?" and "I need to get this fixed". He noted his worry affecting his attention and concentration. He noted having more attention span for tasks he enjoyed  and noted being a prankster or jokester. He noted not doing as well academically as he could have due to lack of interest. He noted beginning to experience symptoms of claustrophobia.  Therapist provided Caleb Arellano with the ASRS v1.1, MDQ, and a panic symptom checklist, via email, to be completed between sessions. The ASRS was provided due to possible symptoms of ADHD reported and the MDQ was provided due to a poor reaction to anti-depressants for both Caleb Arellano and his father, which could be an indication of bipolar symptoms along with other symptoms reported for his father including anger management issues. Caleb Arellano presented as forthcoming and motivated for change. A follow-up was scheduled to create a treatment plan and begin treatment. Therapist answered all questions during the evaluation and contact information was provided.   GAD-7: 15 PHQ-9: [address] LCSW

## 2023-04-02 ENCOUNTER — Other Ambulatory Visit: Payer: Self-pay | Admitting: Internal Medicine

## 2023-04-02 DIAGNOSIS — Z1212 Encounter for screening for malignant neoplasm of rectum: Secondary | ICD-10-CM

## 2023-04-02 DIAGNOSIS — Z1211 Encounter for screening for malignant neoplasm of colon: Secondary | ICD-10-CM

## 2023-04-18 ENCOUNTER — Other Ambulatory Visit: Payer: Self-pay

## 2023-04-18 ENCOUNTER — Emergency Department
Admission: EM | Admit: 2023-04-18 | Discharge: 2023-04-18 | Disposition: A | Discharge status: Home / Self Care | Payer: Medicare HMO | Attending: Emergency Medicine | Admitting: Emergency Medicine

## 2023-04-18 DIAGNOSIS — F419 Anxiety disorder, unspecified: Secondary | ICD-10-CM | POA: Insufficient documentation

## 2023-04-18 DIAGNOSIS — F32A Depression, unspecified: Secondary | ICD-10-CM | POA: Insufficient documentation

## 2023-04-18 MED ORDER — ALPRAZOLAM 0.5 MG PO TABS: 0.5000 mg | ORAL_TABLET | Freq: Two times a day (BID) | ORAL | 0 refills | Status: DC | PRN

## 2023-04-18 NOTE — ED Provider Notes (Signed)
[facility] Provider Note    Event Date/Time   First MD Initiated Contact with Patient 04/18/23 1942     (approximate)   History   Anxiety   HPI  Caleb Arellano is a 72 y.o. male  with PMH of anxiety and mood disorder who presents for evaluation of anxiety and depression.  Patient states he has been actively trying to be seen by psychiatry but has encountered several barriers to care for various reasons.  He tried to go to [facility] but was denied as it was not covered by his [facility].  He was prescribed Zoloft and took it for 1 day and did not like the way it made him feel.  He has been taking Xanax 2-3 times a day to manage his anxiety symptoms.  He denies suicidal ideation and homicidal ideation.      Physical Exam   Triage Vital Signs: ED Triage Vitals  Encounter Vitals Group     BP 04/18/23 1738 (!) 183/100     Systolic BP Percentile --      Diastolic BP Percentile --      Pulse Rate 04/18/23 1738 (!) 102     Resp 04/18/23 1738 20     Temp 04/18/23 1738 98 F (36.7 C)     Temp Source 04/18/23 1738 Oral     SpO2 04/18/23 1738 100 %     Weight 04/18/23 1734 262 lb (118.8 kg)     Height 04/18/23 1734 5' 8.5" (1.74 m)     Head Circumference --      Peak Flow --      Pain Score --      Pain Loc --      Pain Education --      Exclude from Growth Chart --     Most recent vital signs: Vitals:   04/18/23 1738 04/18/23 2032  BP: (!) 183/100 (!) 215/85  Pulse: (!) 102 90  Resp: 20 20  Temp: 98 F (36.7 C)   SpO2: 100% 100%    General: Awake, mild distress, states he feels helpless, anxious. CV:  Good peripheral perfusion.  Resp:  Normal effort.  Abd:  No distention.  Other:   Major depression index score of 27 points equaling moderate depression.   ED Results / Procedures / Treatments   Labs (all labs ordered are listed, but only abnormal results are displayed) Labs Reviewed - No data to display   PROCEDURES:  Critical Care  performed: No  Procedures   MEDICATIONS ORDERED IN ED: Medications - No data to display   IMPRESSION / MDM / ASSESSMENT AND PLAN / ED COURSE  I reviewed the triage vital signs and the nursing notes.                             72 year old male presents for evaluation of anxiety and depression.  Patient's hypertensive in triage otherwise vital signs are stable.  Patient is in mild distress on exam, shaking, states he feels helpless and is frustrated.  Differential diagnosis includes, but is not limited to, depression, anxiety, bipolar.  Patient's presentation is most consistent with acute, uncomplicated illness.  Patient's presentation is consistent with depression.  At this time we do not have access to psychiatry consults or video psychiatry consult.  Patient adamantly denies suicidal ideation and homicidal ideation.  I do not have any concerns that he is a threat to himself or  others.  I do not feel that he is appropriate for a psychiatric hold.  I explained to the patient that I cannot start him on a long-term anxiety medication like an SSRI or an SNRI as I cannot have appropriate follow-up.  We discussed at length possible options for care.  I instructed him to call his primary care provider Monday morning to see if he can get an appointment with him this week to discuss starting an SSRI.  We also discussed reaching out to his insurance company to find local providers in the area.  I also advised him to look into better help for video counseling until he can be seen in person.  I will refill patient's Xanax prescription and he took a dose of his own Xanax while in the ED.  Patient and his wife were agreeable to plan, they voiced understanding and he was stable at discharge.     FINAL CLINICAL IMPRESSION(S) / ED DIAGNOSES   Final diagnoses:  Depression, unspecified depression type     Rx / DC Orders   ED Discharge Orders     None        Note:  This document was prepared  using Dragon voice recognition software and may include unintentional dictation errors.   Cameron Ali, PA-C 04/18/23 2125    Chesley Noon, MD 04/18/23 [phone]

## 2023-04-18 NOTE — ED Triage Notes (Addendum)
Patient is here today with complaints of anxiety; He is prescribed Xanax to take BID as needed; He was put on Zoloft but he did not like the way it made him feel. He is attempting to establish care with a psychiatrist but has hit many hurdles doing so (ie insurance would not cover referral, offices are not taking on new patients); Patient denies SI/HI; He says he has no motivation to do anything and wants to get back to feeling like his normal self

## 2023-04-18 NOTE — Discharge Instructions (Signed)
Please follow-up with your primary care, call his office Monday morning to see if you get can get an appointment this week.  Reach out to your insurance company to determine which psychiatric providers are covered in the area.  Look into betterhelp.com for video counseling.

## 2023-04-18 NOTE — ED Provider Triage Note (Signed)
Emergency Medicine Provider Triage Evaluation Note  Caleb Arellano , a 72 y.o. male  Arellano evaluated in triage.  Pt complains of depression and anxiety. Needs to establish care with a psychiatrist. Has had some barriers to care with getting treatment. No interest in doing normal activities. Tried taking Zoloft for one day and did not like how it made him feel.   Review of Systems  Positive: Anxiety and depression Negative: SI, HI  Physical Exam  Ht 5' 8.5" (1.74 m)   Wt 118.8 kg   BMI 39.26 kg/m  Gen:   Awake, no distress   Resp:  Normal effort  MSK:   Moves extremities without difficulty  Other:    Medical Decision Making  Medically screening exam initiated at 5:36 PM.  Appropriate orders placed.  Caleb Arellano Arellano informed that the remainder of the evaluation will be completed by another provider, this initial triage assessment does not replace that evaluation, and the importance of remaining in the ED until their evaluation is complete.     Cameron Ali, PA-C 04/18/23 1742

## 2023-04-22 ENCOUNTER — Encounter: Payer: Self-pay | Admitting: *Deleted

## 2023-04-22 ENCOUNTER — Encounter: Payer: Self-pay | Admitting: Internal Medicine

## 2023-04-22 ENCOUNTER — Ambulatory Visit: Payer: Medicare HMO | Admitting: Internal Medicine

## 2023-04-22 VITALS — BP 138/80 | HR 86 | Temp 98.2°F | Ht 68.5 in | Wt 268.0 lb

## 2023-04-22 DIAGNOSIS — F39 Unspecified mood [affective] disorder: Secondary | ICD-10-CM

## 2023-04-22 MED ORDER — DULOXETINE HCL 20 MG PO CPEP: 20.0000 mg | ORAL_CAPSULE | Freq: Every day | ORAL | 3 refills | Status: DC

## 2023-04-22 NOTE — Assessment & Plan Note (Signed)
Mostly seems to be anxiety and panic Has some secondary depression but doesn't really seem to be MDD Will refer to different in person counselors Trial duloxetine 20mg  daily---discussed that he might have some irritability at first Continue the xanax prn

## 2023-04-22 NOTE — Progress Notes (Signed)
Subjective:    Patient ID: Caleb Arellano, male    DOB: July 13, 1950, 72 y.o.   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 30 tablet 0   folic acid (FOLVITE) 1 MG tablet TAKE 1 TABLET BY MOUTH EVERY DAY 90 tablet 3   hydrocortisone 2.5 % cream Apply topically 2 (two) times daily as needed. 30 g 1   ketoconazole (NIZORAL) 2 % shampoo Apply 1 application topically 2 (two) times a week. (Patient taking differently: Apply 1 application  topically as needed.) 120 mL 11   spironolactone (ALDACTONE) 50 MG tablet TAKE 1 TABLET BY MOUTH EVERY DAY (Patient taking differently: Takes every other day) 90 tablet 3   thiamine (VITAMIN B-1) 100 MG tablet Take 100 mg by mouth daily.      meclizine (ANTIVERT) 12.5 MG tablet Take 1 tablet (12.5 mg total) by mouth 3 (three) times daily as needed for dizziness. (Patient not taking: Reported on 04/22/2023) 90 tablet 0   No current facility-administered medications on file prior to visit.    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 No pertinent surgical history.  Family History  Problem Relation Age of Onset   Hypertension Mother    Diabetes Mother    Cancer Sister        1 sister with blood cancer, 1 with breast cancer   Stroke Brother    Heart disease Neg Hx     Social History   Socioeconomic History   Marital status: Married    Spouse name: Not on file   Number of children: 3   Years of education: Not on file   Highest education level: 12th grade  Occupational History   Occupation: disabled  Tobacco Use   Smoking status: Former    Types: Cigarettes    Passive exposure: Never   Smokeless tobacco: Never  Substance and Sexual Activity   Alcohol use: No    Comment: alcoholic, stopped recently   Drug use: No   Sexual activity: Not on file  Other Topics Concern   Not on file  Social History Narrative   No living will   Wife, then daughter Scarlette Calico, would be medical decision maker   Would accept resuscitation   No prolonged tube feeds if cognitively unaware  Social Determinants of Health   Financial Resource Strain: High Risk (04/21/2023)   Overall Financial Resource Strain (CARDIA)    Difficulty of Paying Living Expenses: Very hard  Food Insecurity: No Food Insecurity (04/21/2023)   Hunger Vital Sign    Worried About Running Out of Food in the Last Year: Never true    Ran Out of Food in the Last Year: Never true  Transportation Needs: No Transportation Needs (04/21/2023)   PRAPARE - Administrator, Civil Service (Medical): No    Lack of Transportation (Non-Medical): No  Physical Activity: Unknown (04/21/2023)   Exercise Vital Sign    Days of Exercise per Week: Patient declined    Minutes of Exercise per Session: Not on file  Stress: Stress Concern Present  (04/21/2023)   [employer] of Occupational Health - Occupational Stress Questionnaire    Feeling of Stress : Very much  Social Connections: Moderately Isolated (04/21/2023)   Social Connection and Isolation Panel [NHANES]    Frequency of Communication with Friends and Family: Once a week    Frequency of Social Gatherings with Friends and Family: Once a week    Attends Religious Services: Never    Database administrator or Organizations: Yes    Attends Engineer, structural: More than 4 times per year    Marital Status: Married  Catering manager Violence: Not on file   Review of Systems Appetite is gone and he has lost 10# or so Sleeping okay     Objective:   Physical Exam Psychiatric:     Comments: No overt depression Normal interaction            Assessment & Plan:

## 2023-04-26 ENCOUNTER — Ambulatory Visit (INDEPENDENT_AMBULATORY_CARE_PROVIDER_SITE_OTHER): Payer: Medicare HMO | Admitting: Internal Medicine

## 2023-04-26 ENCOUNTER — Encounter: Payer: Self-pay | Admitting: Internal Medicine

## 2023-04-26 VITALS — BP 148/80 | HR 108 | Temp 98.4°F | Ht 68.5 in | Wt 265.0 lb

## 2023-04-26 DIAGNOSIS — F39 Unspecified mood [affective] disorder: Secondary | ICD-10-CM | POA: Diagnosis not present

## 2023-04-26 NOTE — Progress Notes (Signed)
Subjective:    Patient ID: Caleb Arellano, male    DOB: 04/30/1951, 72 y.o.   MRN: [id number]  HPI Here due to ongoing mood problems and trouble with the duloxetine  Woke in night sweating after the first dose of duloxetine Had "disturbing thoughts about dying" So he didn't take another dose  Was okay until coming in here--now having more panic type symptoms Xanax helps the anxiety some --"but I don't feel like myself"  This has been building over the past couple of years Long standing anxiety--doesn't think he self medicated with alcohol  Has lost interest in hobbies Afraid to go out---concerned he may have anxiety attack Still no suicidal ideation  Current Outpatient Medications on File Prior to Visit  Medication Sig Dispense Refill   ALPRAZolam (XANAX) 0.5 MG tablet Take 1-2 tablets (0.5-1 mg total) by mouth 2 (two) times daily as needed for anxiety. 30 tablet 0   folic acid (FOLVITE) 1 MG tablet TAKE 1 TABLET BY MOUTH EVERY DAY 90 tablet 3   hydrocortisone 2.5 % cream Apply topically 2 (two) times daily as needed. 30 g 1   ketoconazole (NIZORAL) 2 % shampoo Apply 1 application topically 2 (two) times a week. (Patient taking differently: Apply 1 application  topically as needed.) 120 mL 11   meclizine (ANTIVERT) 12.5 MG tablet Take 1 tablet (12.5 mg total) by mouth 3 (three) times daily as needed for dizziness. 90 tablet 0   spironolactone (ALDACTONE) 50 MG tablet TAKE 1 TABLET BY MOUTH EVERY DAY (Patient taking differently: Takes every other day) 90 tablet 3   thiamine (VITAMIN B-1) 100 MG tablet Take 100 mg by mouth daily.      No current facility-administered medications on file prior to visit.    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 No pertinent surgical history.  Family History  Problem Relation Age of Onset   Hypertension Mother    Diabetes Mother    Cancer Sister        1 sister with blood cancer, 1 with breast cancer   Stroke Brother    Heart disease Neg Hx     Social History   Socioeconomic History   Marital status: Married    Spouse name: Not on file   Number of children: 3   Years of education: Not on file   Highest education level: 12th grade  Occupational History   Occupation: disabled  Tobacco Use   Smoking status: Former    Types: Cigarettes    Passive exposure: Never   Smokeless tobacco: Never  Substance and Sexual Activity   Alcohol use: No    Comment: alcoholic, stopped recently   Drug use: No   Sexual activity: Not on file  Other Topics Concern   Not on file  Social History Narrative   No living will   Wife, then daughter Scarlette Calico, would be medical decision maker   Would accept resuscitation   No prolonged tube feeds if cognitively unaware   Social Determinants of Health   Financial Resource Strain: High Risk (04/21/2023)   Overall Financial Resource Strain (CARDIA)  Difficulty of Paying Living Expenses: Very hard  Food Insecurity: No Food Insecurity (04/21/2023)   Hunger Vital Sign    Worried About Running Out of Food in the Last Year: Never true    Ran Out of Food in the Last Year: Never true  Transportation Needs: No Transportation Needs (04/21/2023)   PRAPARE - Administrator, Civil Service (Medical): No    Lack of Transportation (Non-Medical): No  Physical Activity: Unknown (04/21/2023)   Exercise Vital Sign    Days of Exercise per Week: Patient declined    Minutes of Exercise per Session: Not on file  Stress: Stress Concern Present (04/21/2023)   [employer] of Occupational Health - Occupational Stress Questionnaire    Feeling of Stress  : Very much  Social Connections: Moderately Isolated (04/21/2023)   Social Connection and Isolation Panel [NHANES]    Frequency of Communication with Friends and Family: Once a week    Frequency of Social Gatherings with Friends and Family: Once a week    Attends Religious Services: Never    Database administrator or Organizations: Yes    Attends Engineer, structural: More than 4 times per year    Marital Status: Married  Catering manager Violence: Not on file   Review of Systems Still gets sleepy with the xanax--so usually cuts it in half ---even at bedtime    Objective:   Physical Exam Psychiatric:     Comments: Very upset at first---panicky Then calmed during the visit            Assessment & Plan:

## 2023-04-26 NOTE — Telephone Encounter (Signed)
May need to consider long acting benzo then--?clonazepam or lorazepam

## 2023-04-26 NOTE — Telephone Encounter (Signed)
I spoke with Caleb Arellano;Caleb Arellano has no SI/HI and Caleb Arellano scheduled appt to see Dr Alphonsus Sias 04/26/23 at 2pm; Caleb Arellano will ck in at front desk at 1;45. With UC & ED precautions given and Caleb Arellano voiced understanding. Sending note to Dr Alphonsus Sias.

## 2023-04-26 NOTE — Assessment & Plan Note (Signed)
Still mostly anxious Hasn't tolerated duloxetine or sertraline (even just one dose was bad)-- "I am scared of them"---he may be having panic from taking a medicine--not an actual medicine reaction Uses the xanax but still doesn't feel normal He has a virtual visit with CSW again tomorrow---will see how that goes Might consider buspirone --but I am concerned about starting any

## 2023-04-27 ENCOUNTER — Ambulatory Visit (INDEPENDENT_AMBULATORY_CARE_PROVIDER_SITE_OTHER): Payer: Medicare HMO | Admitting: Psychology

## 2023-04-27 DIAGNOSIS — F418 Other specified anxiety disorders: Secondary | ICD-10-CM

## 2023-04-27 NOTE — Progress Notes (Signed)
[facility] Counselor/Therapist Progress Note  Patient ID: Caleb Arellano, MRN: [id number]   Date: 04/27/23  Time Spent: 3:03  am - 4:03 am : 60 Minutes  Treatment Type: Individual Therapy.  Reported Symptoms: depression and anxiety.   Mental Status Exam: Appearance:  Casual     Behavior: Appropriate  Motor: Normal  Speech/Language:  Clear and Coherent  Affect: Congruent  Mood: anxious  Thought process: normal  Thought content:   WNL  Sensory/Perceptual disturbances:   WNL  Orientation: oriented to person, place, time/date, and situation  Attention: Good  Concentration: Good  Memory: WNL  Fund of knowledge:  Good  Insight:   Good  Judgment:  Good  Impulse Control: Good   Risk Assessment: Danger to Self:  No Self-injurious Behavior: No Danger to Others: No Duty to Warn:no Physical Aggression / Violence:No  Access to Firearms a concern: No  Gang Involvement:No   Subjective:   Caleb Arellano participated from home, via video and consented to treatment. Therapist participated from home office. I discussed the limitations of evaluation and management by telemedicine and the availability of in person appointments. The patient expressed understanding and agreed to proceed. Fallon reviewed the events of the past week.  We reviewed numerous treatment approaches including CBT, BA, Problem Solving, and Solution focused therapy. Psych-education regarding the Perle's diagnosis of No diagnosis found. was provided during the session. We discussed Caleb Arellano's goals treatment goals which include  manage his symptoms, reconnect to normal routine, engage in enjoyable activities, improve frustration tolerance, learn about cycle of anxiety or depression, manage claustrophobia, improve coping, manage negative self-talk, proactive and reactive symptom management, and processing events and experiences. Caleb Arellano provided verbal approval of the treatment plan. Therapist  provided handouts for psycho-education regarding anxiety and panic, isometric muscle exercises, and 4/7/8 breathing exercise.  Psycho-education was provided and interventions were modeled. Caleb Arellano will practices these tools between session. He is interested in self-help groups face to face or virtual.Recent stressors include. Loss of two brothers, sister has 6-12 months to live, a serious fungal infection in ear, a shingle diagnosis, two pets having to be euthanize, and financial stressors.    ASRS V1.1  Part-A 1. How often do you have trouble wrapping up the final details of a project,     once the challenging parts have been done? Rarely 2. How often do you have difficulty getting things in order when you have to do     a task that requires organization? Rarely 3. How often do you have problems remembering appointments or obligations? Never 4. When you have a task that requires a lot of thought, how often do you avoid     or delay getting started? Rarely 5. How often do you fidget or squirm with your hands or feet when you have     to sit down for a long time? Never 6. How often do you feel overly active and compelled to do things, like you     were driven by a motor? Never  Part-B 7. How often do you make careless mistakes when you have to work on a boring or     difficult project? Rarely 8. How often do you have difficulty keeping your attention when you are doing boring     or repetitive work? Never 9. How often do you have difficulty concentrating on what people say to you,      even when they are speaking to you directly?  Never 10. How often do you misplace or have difficulty finding things at home or at work? Rarely 11. How often are you distracted by activity or noise around you? Rarely 12. How often do you leave your seat in meetings or other situations in which       you are expected to remain seated? Never 13. How often do you feel restless or fidgety? Never 14. How often do  you have difficulty unwinding and relaxing when you have time       to yourself? Never 15. How often do you find yourself talking too much when you are in social situations? Never 16. When you're in a conversation, how often do you find yourself finishing           the sentences of the people you are talking to, before they can finish       them themselves? Never 17. How often do you have difficulty waiting your turn in situations when       turn taking is required? Never 18. How often do you interrupt others when they are busy? Never    Interventions: Psycho-education & Goal Setting.   Diagnosis:  No diagnosis found.  Psychiatric Treatment: Yes , via PCP. See chart.    Treatment Plan:  Client Abilities/Strengths Caleb Arellano is intelligent, self-aware, and motivated for change.   Support System: Family  Client Treatment Preferences Outpatient Therapy  Client Statement of Needs Caleb Arellano would like to manage his symptoms, reconnect to normal routine, engage in enjoyable activities, improve frustration tolerance, learn about cycle of anxiety or depression, manage claustrophobia, improve coping, manage negative self-talk, proactive and reactive symptom management, and processing events and experiences.   Treatment Level Biweekly  Symptoms  Depression: loss of interest, feeling down, lethargy, poor appetite, feeling bad about self, trouble concentrating.   (Status: maintained) Anxiety: feeling anxious, difficulty managing worry, worrying about different things, trouble relaxing, restlessness, irritability, and feeling afraid as if something awful might happen.   (Status: maintained)  Goals:   [name] experiences symptoms of anxiety and depression.   Treatment plan signed and available on s-drive:  No, pending o   Target Date: 04/26/24 Frequency: Weekly  Progress: 0 Modality: individual    Therapist will provide referrals for additional resources as appropriate.  Therapist will  provide psycho-education regarding Caleb Arellano's diagnosis and corresponding treatment approaches and interventions. Licensed Clinical Social Worker, [place], LCSW will support the patient's ability to achieve the goals identified. will employ CBT, BA, Problem-solving, Solution Focused, Mindfulness,  coping skills, & other evidenced-based practices will be used to promote progress towards healthy functioning to help manage decrease symptoms associated with his diagnosis.   Reduce overall level, frequency, and intensity of the feelings of depression, anxiety and panic evidenced by decreased overall symptoms from 6 to 7 days/week to 0 to 1 days/week per client report for at least 3 consecutive months. Verbally express understanding of the relationship between feelings of depression, anxiety and their impact on thinking patterns and behaviors. Verbalize an understanding of the role that distorted thinking plays in creating fears, excessive worry, and ruminations.    (Terrence participated in the creation of the treatment plan)    Delight Ovens, LCSW

## 2023-04-29 NOTE — Telephone Encounter (Signed)
Spoke to pt. He had another appointment with [facility] 2 days ago and said it went better than he had expected. York Spaniel he has a few more appointments scheduled with him.

## 2023-04-29 NOTE — Telephone Encounter (Signed)
That is good to hear 

## 2023-04-29 NOTE — Telephone Encounter (Signed)
Received a fax from [facility] stating they only treat 9-65 year olds.

## 2023-05-06 ENCOUNTER — Encounter: Payer: Self-pay | Admitting: Internal Medicine

## 2023-05-06 ENCOUNTER — Ambulatory Visit: Payer: Medicare HMO | Admitting: Internal Medicine

## 2023-05-06 VITALS — BP 138/86 | HR 85 | Temp 97.8°F | Ht 68.5 in | Wt 267.0 lb

## 2023-05-06 DIAGNOSIS — F39 Unspecified mood [affective] disorder: Secondary | ICD-10-CM | POA: Diagnosis not present

## 2023-05-06 NOTE — Progress Notes (Signed)
Subjective:    Patient ID: Caleb Arellano, male    DOB: [birth year], 72 y.o.   MRN: [id number]  HPI Here for follow up of anxiety and depression  Felling a little better Still having regular panic feeling--the "xanax keeps it at bay" (able to get out to store, etc) Did meet with [facility]--the LCSW Trying exercises and breathing routines Not sure that is helping that much Looking for other resources  Has tried soothing tone and breathing exercises--may be helping  Some despair still "I just can't be me----and stop the worrying"  Current Outpatient Medications on File Prior to Visit  Medication Sig Dispense Refill   ALPRAZolam (XANAX) 0.5 MG tablet Take 1-2 tablets (0.5-1 mg total) by mouth 2 (two) times daily as needed for anxiety. 30 tablet 0   folic acid (FOLVITE) 1 MG tablet TAKE 1 TABLET BY MOUTH EVERY DAY 90 tablet 3   hydrocortisone 2.5 % cream Apply topically 2 (two) times daily as needed. 30 g 1   ketoconazole (NIZORAL) 2 % shampoo Apply 1 application topically 2 (two) times a week. (Patient taking differently: Apply 1 application  topically as needed.) 120 mL 11   meclizine (ANTIVERT) 12.5 MG tablet Take 1 tablet (12.5 mg total) by mouth 3 (three) times daily as needed for dizziness. 90 tablet 0   spironolactone (ALDACTONE) 50 MG tablet TAKE 1 TABLET BY MOUTH EVERY DAY (Patient taking differently: Takes every other day) 90 tablet 3   thiamine (VITAMIN B-1) 100 MG tablet Take 100 mg by mouth daily.      No current facility-administered medications on file prior to visit.    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 No pertinent  surgical history.  Family History  Problem Relation Age of Onset   Hypertension Mother    Diabetes Mother    Cancer Sister        1 sister with blood cancer, 1 with breast cancer   Stroke Brother    Heart disease Neg Hx     Social History   Socioeconomic History   Marital status: Married    Spouse name: Not on file   Number of children: 3   Years of education: Not on file   Highest education level: 12th grade  Occupational History   Occupation: disabled  Tobacco Use   Smoking status: Former    Types: Cigarettes    Passive exposure: Never   Smokeless tobacco: Never  Substance and Sexual Activity   Alcohol use: No    Comment: alcoholic, stopped recently   Drug use: No   Sexual activity: Not on file  Other Topics Concern   Not on file  Social History Narrative   No living will   Wife, then daughter Scarlette Calico, would be medical decision maker   Would accept resuscitation   No prolonged tube feeds if cognitively unaware   Social Determinants of Health   Financial Resource Strain: High Risk (04/21/2023)   Overall Financial Resource Strain (CARDIA)    Difficulty of Paying Living Expenses: Very hard  Food Insecurity: No Food Insecurity (04/21/2023)   Hunger Vital Sign  Worried About Programme researcher, broadcasting/film/video in the Last Year: Never true    Ran Out of Food in the Last Year: Never true  Transportation Needs: No Transportation Needs (04/21/2023)   PRAPARE - Administrator, Civil Service (Medical): No    Lack of Transportation (Non-Medical): No  Physical Activity: Unknown (04/21/2023)   Exercise Vital Sign    Days of Exercise per Week: Patient declined    Minutes of Exercise per Session: Not on file  Stress: Stress Concern Present (04/21/2023)   [employer] of Occupational Health - Occupational Stress Questionnaire    Feeling of Stress : Very much  Social Connections: Moderately Isolated (04/21/2023)   Social Connection and Isolation Panel [NHANES]     Frequency of Communication with Friends and Family: Once a week    Frequency of Social Gatherings with Friends and Family: Once a week    Attends Religious Services: Never    Database administrator or Organizations: Yes    Attends Engineer, structural: More than 4 times per year    Marital Status: Married  Catering manager Violence: Not on file   Review of Systems Eating okay Sleeping fine--awakening earlier lately    Objective:   Physical Exam Constitutional:      Appearance: Normal appearance.  Neurological:     Mental Status: He is alert.  Psychiatric:     Comments: Calm today No overt depression            Assessment & Plan:

## 2023-05-06 NOTE — Patient Instructions (Signed)
How to help anxiety - without medication.   1) Regular Exercise - walking, jogging, cycling, dancing, strength training   2)  Begin a Mindfulness/Meditation practice -- this can take a little as 3 minutes and is helpful for all kinds of mood issues  -- You can find resources in books  -- Or you can download apps like  ---- Headspace App (which currently has free content called "Weathering the Storm")  ---- Calm (which has a few free options)  ---- Insignt Timer  ---- Stop, Breathe & Think   # With each of these Apps - you should decline the "start free trial" offer and as you search through the App should be able to access some of their free content. You can also chose to pay for the content if you find one that works well for you.   # Many of them also offer sleep specific content which may help with insomnia   3) Healthy Diet  -- Avoid or decrease Caffeine  -- Avoid or decrease Alcohol  -- Drink plenty of water, have a balanced diet  -- Avoid cigarettes and marijuana (as well as other recreational drugs)   4) Consider contacting a professional therapist   

## 2023-05-06 NOTE — Assessment & Plan Note (Signed)
Is somewhat better Discussed phone apps for immediate availability Continue the xanax Wonders about lexapro that a friend took---but we will hold off since he had such problems

## 2023-05-11 ENCOUNTER — Telehealth: Payer: Self-pay | Admitting: Internal Medicine

## 2023-05-11 NOTE — Telephone Encounter (Signed)
Prescription Request  05/11/2023  LOV: 05/06/2023  What is the name of the medication or equipment?  ALPRAZolam (XANAX) 0.5 MG tablet   Have you contacted your pharmacy to request a refill? Yes   Which pharmacy would you like this sent to?  [facility] [id number] [address], [place] - [address] [place] [address] Phone: [phone] Fax: [phone]    Patient notified that their request is being sent to the clinical staff for review and that they should receive a response within 2 business days.   Please advise at [facility] [phone]

## 2023-05-12 ENCOUNTER — Other Ambulatory Visit: Payer: Self-pay | Admitting: Internal Medicine

## 2023-05-12 MED ORDER — ALPRAZOLAM 0.5 MG PO TABS: 0.5000 mg | ORAL_TABLET | Freq: Two times a day (BID) | ORAL | 0 refills | Status: DC | PRN

## 2023-05-17 DIAGNOSIS — H6063 Unspecified chronic otitis externa, bilateral: Secondary | ICD-10-CM | POA: Diagnosis not present

## 2023-05-17 DIAGNOSIS — H6123 Impacted cerumen, bilateral: Secondary | ICD-10-CM | POA: Diagnosis not present

## 2023-05-18 ENCOUNTER — Ambulatory Visit (INDEPENDENT_AMBULATORY_CARE_PROVIDER_SITE_OTHER): Payer: Medicare HMO | Admitting: Psychology

## 2023-05-18 DIAGNOSIS — F418 Other specified anxiety disorders: Secondary | ICD-10-CM

## 2023-05-18 NOTE — Progress Notes (Signed)
[facility] Counselor/Therapist Progress Note  Patient ID: Caleb Arellano, MRN: [id number]   Date: 05/18/23  Time Spent: 3:03  pm - 3:48 pm : 45 Minutes  Treatment Type: Individual Therapy.  Reported Symptoms: depression and anxiety.   Mental Status Exam: Appearance:  Casual     Behavior: Appropriate  Motor: Normal  Speech/Language:  Clear and Coherent  Affect: Congruent  Mood: anxious  Thought process: normal  Thought content:   WNL  Sensory/Perceptual disturbances:   WNL  Orientation: oriented to person, place, time/date, and situation  Attention: Good  Concentration: Good  Memory: WNL  Fund of knowledge:  Good  Insight:   Good  Judgment:  Good  Impulse Control: Good   Risk Assessment: Danger to Self:  No Self-injurious Behavior: No Danger to Others: No Duty to Warn:no Physical Aggression / Violence:No  Access to Firearms a concern: No  Gang Involvement:No   Subjective:   Caleb Arellano participated from home, via video and consented to treatment. Therapist participated from home office. Caleb Arellano reviewed the events of the past week. Caleb Arellano noted continuing to feel a sense of despair. He noted around `10 months ago and getting caught in traffic. He noted feeling trapped in both.   He noted around 8 pm, he feels better around 50% of the time and a higher percentage of feeling somewhat better. He endorsed panic, on and  off,  for decades. Then feelings of despair around 10 months ago which led for claustrophobia. I am anxious because I am going to get disappointed that he is not himself. He noted worry that he would beat on the elevator doors and that it would be unmanageable. He noted worry that he would go crazy, be hysterical, and beat on the door. He worries that he would be stuck for hours. He denied any such experience in the past but did note getting stuck in an elavator many years ago and being to get out quickly and without much distress. We worked on  defining what despair means. He noted worry and embarrassment if he would panic in front of others. He noted being typically fun, outgoing, and center stage. No desire to do anything and "lazy" and "not having the will to do it". He noted often reading and, at times, he will fall sleep. He noted not being able to remember much of what he read but sometimes worrying while reading. He noted his worries include wife's health, not being able out to do things, bills, and finances in general. He noted having difficulty engaging outside of the house to go shopping, camping, or eat dinner. Therapist encouraged Caleb Arellano to document his self-talk regarding these feelings and words he uses including "despair, hopeless". Therapist provided psycho-education regarding self-talk and the effects of this on cognitions and mood. We will work on managing the negative self-talk, going forward and using more adaptive and flexible language. Therapist highlighted that Caleb Arellano often will label the day and "good or bad" and this dictates his actions and thoughts going forward. Therapist provided psycho-education regarding thoughts, feelings, and behaviors and cognitive distortions. Therapist encouraged Caleb Arellano to be mindful of this going forward. Therapist encouraged a psychiatric consult and will work on identifying possible resources. Caleb Arellano committed to contacting his [employer], as well, for additional resources. Therapist validated Caleb Arellano's feelings and experience and provided supportive therapy. A follow-up is scheduled for continued treatment.    Interventions: CBT  Diagnosis:  Other specified anxiety disorders  Psychiatric Treatment: Yes , via  PCP. See chart.    Treatment Plan:  Client Abilities/Strengths Abdulkareem is intelligent, self-aware, and motivated for change.   Support System: Family  Client Treatment Preferences Outpatient Therapy  Client Statement of Needs Caleb Arellano would like to manage his symptoms,  reconnect to normal routine, engage in enjoyable activities, improve frustration tolerance, learn about cycle of anxiety or depression, manage claustrophobia, improve coping, manage negative self-talk, proactive and reactive symptom management, and processing events and experiences.   Treatment Level Biweekly  Symptoms  Depression: loss of interest, feeling down, lethargy, poor appetite, feeling bad about self, trouble concentrating.   (Status: maintained) Anxiety: feeling anxious, difficulty managing worry, worrying about different things, trouble relaxing, restlessness, irritability, and feeling afraid as if something awful might happen.   (Status: maintained)  Goals:   [name] experiences symptoms of anxiety and depression.   Treatment plan signed and available on s-drive:  No, pending o   Target Date: 04/26/24 Frequency: Weekly  Progress: 0 Modality: individual    Therapist will provide referrals for additional resources as appropriate.  Therapist will provide psycho-education regarding [name]'s diagnosis and corresponding treatment approaches and interventions. Licensed Clinical Social Worker, [place], LCSW will support the patient's ability to achieve the goals identified. will employ CBT, BA, Problem-solving, Solution Focused, Mindfulness,  coping skills, & other evidenced-based practices will be used to promote progress towards healthy functioning to help manage decrease symptoms associated with his diagnosis.   Reduce overall level, frequency, and intensity of the feelings of depression, anxiety and panic evidenced by decreased overall symptoms from 6 to 7 days/week to 0 to 1 days/week per client report for at least 3 consecutive months. Verbally express understanding of the relationship between feelings of depression, anxiety and their impact on thinking patterns and behaviors. Verbalize an understanding of the role that distorted thinking plays in creating fears, excessive  worry, and ruminations.    (Caleb Arellano participated in the creation of the treatment plan)    Delight Ovens, LCSW

## 2023-05-25 ENCOUNTER — Ambulatory Visit: Payer: Medicare HMO | Admitting: Psychology

## 2023-05-25 DIAGNOSIS — F418 Other specified anxiety disorders: Secondary | ICD-10-CM | POA: Diagnosis not present

## 2023-05-25 NOTE — Progress Notes (Signed)
[facility] Counselor/Therapist Progress Note  Patient ID: Caleb Arellano, MRN: [id number]   Date: 05/25/23  Time Spent: 3:04  pm - 3:56 pm : 52 Minutes  Treatment Type: Individual Therapy.  Reported Symptoms: depression and anxiety.   Mental Status Exam: Appearance:  Casual     Behavior: Appropriate  Motor: Normal  Speech/Language:  Clear and Coherent  Affect: Congruent  Mood: anxious  Thought process: normal  Thought content:   WNL  Sensory/Perceptual disturbances:   WNL  Orientation: oriented to person, place, time/date, and situation  Attention: Good  Concentration: Good  Memory: WNL  Fund of knowledge:  Good  Insight:   Good  Judgment:  Good  Impulse Control: Good   Risk Assessment: Danger to Self:  No Self-injurious Behavior: No Danger to Others: No Duty to Warn:no Physical Aggression / Violence:No  Access to Firearms a concern: No  Gang Involvement:No   Subjective:   Caleb Arellano participated from home, via video and consented to treatment. Therapist participated from home office. Caleb Arellano. Caleb noted waking up in that "funk mood" again. Caleb noted his daughter's recent breakup with her fiance and noted this affecting the thanksgiving plan. Caleb noted often people not considering his perspective and noted that others think Caleb will recover. Caleb noted his wife often stating "put on your big boy britches and get over it" and that Caleb does not usually share with others. Caleb noted a lack of emotional support from others. Caleb noted the worry that Caleb would be spurned or that it might be taxing for others. Caleb noted disconnecting from enjoyable activities and self-care appointments including eye doctor's appointments. Caleb noted consistent worry regarding finances & loss of ability (had avery active lifestyle). Therapist highlighted emotional reasoning, negative cognitions, and decision-making based on mood. Caleb Arellano  that despite his effort to get better, then Caleb cannot make traction. Caleb Arellano when feeling discomfort but could not identify why there is discomfort.  Therapist provided psychoeducation regarding importance of challenging negative thoughts and Arellano.  We worked on differentiate between thoughts and Arellano and discussed the importance of recognizing emotional reasoning.  Caleb Arellano discussed his worry that Caleb will panic in public.  We continue to process this and therapist provided additional tools to manage anxiety and panic including breathing exercises, relaxation, guided meditation.  Therapist challenge negative self-talk during the session and encouraged Caleb Arellano to be mindful of his self-talk.  When Caleb noted interest and psychiatric consult.  Therapist encouraged Caleb Arellano to contact his insurance company and request a list of local psychiatrists.  We work on processing this and selecting a provider during our next follow-up therapist validated Caleb Arellano and experience and provided supportive therapy.  A follow-up was scheduled for continued treatment.  Interventions: CBT  Diagnosis:  Other specified anxiety disorders  Psychiatric Treatment: Yes , via PCP. See chart.    Treatment Plan:  Client Abilities/Strengths Caleb Arellano is intelligent, self-aware, and motivated for change.   Support System: Family  Client Treatment Preferences Outpatient Therapy  Client Statement of Needs [name] would like to manage his symptoms, reconnect to normal routine, engage in enjoyable activities, improve frustration tolerance, learn about cycle of anxiety or depression, manage claustrophobia, improve coping, manage negative self-talk, proactive and reactive symptom management, and processing events and experiences.   Treatment Level Biweekly  Symptoms  Depression: loss of interest, feeling down, lethargy, poor appetite, feeling bad about self, trouble concentrating.   (  Status:  maintained) Anxiety: feeling anxious, difficulty managing worry, worrying about different things, trouble relaxing, restlessness, irritability, and feeling afraid as if something awful might happen.   (Status: maintained)  Goals:   [name] experiences symptoms of anxiety and depression.   Treatment plan signed and available on s-drive:  No, pending o   Target Date: 04/26/24 Frequency: Weekly  Progress: 0 Modality: individual    Therapist will provide referrals for additional resources as appropriate.  Therapist will provide psycho-education regarding [name]'s diagnosis and corresponding treatment approaches and interventions. Licensed Clinical Social Worker, [facility], LCSW will support the patient's ability to achieve the goals identified. will employ CBT, BA, Problem-solving, Solution Focused, Mindfulness,  coping skills, & other evidenced-based practices will be used to promote progress towards healthy functioning to help manage decrease symptoms associated with his diagnosis.   Reduce overall level, frequency, and intensity of the Arellano of depression, anxiety and panic evidenced by decreased overall symptoms from 6 to 7 days/Arellano to 0 to 1 days/Arellano per client report for at least 3 consecutive months. Verbally express understanding of the relationship between Arellano of depression, anxiety and their impact on thinking patterns and behaviors. Verbalize an understanding of the role that distorted thinking plays in creating fears, excessive worry, and ruminations.    (Kiren participated in the creation of the treatment plan)    Delight Ovens, LCSW

## 2023-05-31 ENCOUNTER — Other Ambulatory Visit: Payer: Self-pay | Admitting: Internal Medicine

## 2023-05-31 ENCOUNTER — Ambulatory Visit: Payer: Medicare HMO | Admitting: Psychology

## 2023-05-31 ENCOUNTER — Encounter: Payer: Self-pay | Admitting: Internal Medicine

## 2023-05-31 ENCOUNTER — Ambulatory Visit (INDEPENDENT_AMBULATORY_CARE_PROVIDER_SITE_OTHER): Payer: Medicare HMO | Admitting: Internal Medicine

## 2023-05-31 VITALS — BP 130/84 | HR 88 | Temp 98.1°F | Ht 68.5 in | Wt 267.0 lb

## 2023-05-31 DIAGNOSIS — F39 Unspecified mood [affective] disorder: Secondary | ICD-10-CM

## 2023-05-31 MED ORDER — ALPRAZOLAM 0.5 MG PO TABS: 0.5000 mg | ORAL_TABLET | Freq: Two times a day (BID) | ORAL | 0 refills | Status: DC | PRN

## 2023-05-31 NOTE — Progress Notes (Signed)
Subjective:    Patient ID: Caleb Arellano, male    DOB: Apr 30, 1951, 72 y.o.   MRN: [id number]  HPI Here for follow up of severe anxiety  Anxiety is better Working on despair with Camila Li Working with him about finding local psychiatrist--and maybe a support group Uses the alprazolam most days---mostly just takes 1/2 at a time  Current Outpatient Medications on File Prior to Visit  Medication Sig Dispense Refill   ALPRAZolam (XANAX) 0.5 MG tablet Take 1-2 tablets (0.5-1 mg total) by mouth 2 (two) times daily as needed for anxiety. 30 tablet 0   folic acid (FOLVITE) 1 MG tablet TAKE 1 TABLET BY MOUTH EVERY DAY 90 tablet 3   hydrocortisone 2.5 % cream Apply topically 2 (two) times daily as needed. 30 g 1   ketoconazole (NIZORAL) 2 % shampoo Apply 1 application topically 2 (two) times a week. (Patient taking differently: Apply 1 application  topically as needed.) 120 mL 11   meclizine (ANTIVERT) 12.5 MG tablet Take 1 tablet (12.5 mg total) by mouth 3 (three) times daily as needed for dizziness. 90 tablet 0   mometasone (ELOCON) 0.1 % cream Apply 1 Application topically daily.     spironolactone (ALDACTONE) 50 MG tablet TAKE 1 TABLET BY MOUTH EVERY DAY (Patient taking differently: Takes every other day) 90 tablet 3   thiamine (VITAMIN B-1) 100 MG tablet Take 100 mg by mouth daily.      No current facility-administered medications on file prior to visit.    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 No pertinent surgical history.  Family History  Problem Relation Age of Onset   Hypertension Mother    Diabetes Mother    Cancer Sister        1  sister with blood cancer, 1 with breast cancer   Stroke Brother    Heart disease Neg Hx     Social History   Socioeconomic History   Marital status: Married    Spouse name: Not on file   Number of children: 3   Years of education: Not on file   Highest education level: 12th grade  Occupational History   Occupation: disabled  Tobacco Use   Smoking status: Former    Types: Cigarettes    Passive exposure: Never   Smokeless tobacco: Never  Substance and Sexual Activity   Alcohol use: No    Comment: alcoholic, stopped recently   Drug use: No   Sexual activity: Not on file  Other Topics Concern   Not on file  Social History Narrative   No living will   Wife, then daughter Scarlette Calico, would be medical decision maker   Would accept resuscitation   No prolonged tube feeds if cognitively unaware   Social Determinants of Health   Financial Resource Strain: High Risk (04/21/2023)   Overall Financial Resource Strain (CARDIA)    Difficulty of Paying Living Expenses: Very hard  Food Insecurity: No Food Insecurity (04/21/2023)   Hunger Vital Sign    Worried About Running Out of Food in the Last Year: Never true    Ran Out of Food  in the Last Year: Never true  Transportation Needs: No Transportation Needs (04/21/2023)   PRAPARE - Administrator, Civil Service (Medical): No    Lack of Transportation (Non-Medical): No  Physical Activity: Unknown (04/21/2023)   Exercise Vital Sign    Days of Exercise per Week: Patient declined    Minutes of Exercise per Session: Not on file  Stress: Stress Concern Present (04/21/2023)   [employer] of Occupational Health - Occupational Stress Questionnaire    Feeling of Stress : Very much  Social Connections: Moderately Isolated (04/21/2023)   Social Connection and Isolation Panel [NHANES]    Frequency of Communication with Friends and Family: Once a week    Frequency of Social Gatherings with Friends and Family: Once a week     Attends Religious Services: Never    Database administrator or Organizations: Yes    Attends Engineer, structural: More than 4 times per year    Marital Status: Married  Catering manager Violence: Not on file   Review of Systems Sleeps fine Appetite is not great--weight now stabilized after losing 10# with the severe anxiety     Objective:   Physical Exam Constitutional:      Appearance: Normal appearance.  Neurological:     Mental Status: He is alert.  Psychiatric:        Mood and Affect: Mood normal.        Behavior: Behavior normal.     Comments: Calm now            Assessment & Plan:

## 2023-05-31 NOTE — Assessment & Plan Note (Signed)
Severe anxiety is better with low doses of alprazolam Last refill of #30 was 11/13 Discussed that this shouldn't cause physical dependence Is interested in looking into TMS--discussed Continues with counselor Very bad reaction with 2 SSRIs---so will hold off on anything else (discussed considering buspirone)

## 2023-06-01 ENCOUNTER — Ambulatory Visit: Payer: Medicare HMO | Admitting: Psychology

## 2023-06-16 ENCOUNTER — Other Ambulatory Visit: Payer: Self-pay | Admitting: Internal Medicine

## 2023-06-16 MED ORDER — ALPRAZOLAM 0.5 MG PO TABS: 0.5000 mg | ORAL_TABLET | Freq: Two times a day (BID) | ORAL | 0 refills | Status: DC | PRN

## 2023-06-22 ENCOUNTER — Other Ambulatory Visit: Payer: Self-pay

## 2023-06-22 ENCOUNTER — Emergency Department: Payer: Medicare HMO

## 2023-06-22 ENCOUNTER — Emergency Department
Admission: EM | Admit: 2023-06-22 | Discharge: 2023-06-23 | Disposition: A | Discharge status: Home / Self Care | Payer: Medicare HMO | Attending: Emergency Medicine | Admitting: Emergency Medicine

## 2023-06-22 DIAGNOSIS — I10 Essential (primary) hypertension: Secondary | ICD-10-CM | POA: Diagnosis not present

## 2023-06-22 DIAGNOSIS — F419 Anxiety disorder, unspecified: Secondary | ICD-10-CM | POA: Insufficient documentation

## 2023-06-22 DIAGNOSIS — R42 Dizziness and giddiness: Secondary | ICD-10-CM | POA: Insufficient documentation

## 2023-06-22 LAB — URINALYSIS, ROUTINE W REFLEX MICROSCOPIC
Bilirubin Urine: NEGATIVE
Glucose, UA: NEGATIVE mg/dL
Hgb urine dipstick: NEGATIVE
Ketones, ur: NEGATIVE mg/dL
Leukocytes,Ua: NEGATIVE
Nitrite: NEGATIVE
Protein, ur: NEGATIVE mg/dL
Specific Gravity, Urine: 1.009 (ref 1.005–1.030)
pH: 6 (ref 5.0–8.0)

## 2023-06-22 LAB — CBC
HCT: 49.8 % (ref 39.0–52.0)
Hemoglobin: 16.6 g/dL (ref 13.0–17.0)
MCH: 30.4 pg (ref 26.0–34.0)
MCHC: 33.3 g/dL (ref 30.0–36.0)
MCV: 91.2 fL (ref 80.0–100.0)
Platelets: 255 10*3/uL (ref 150–400)
RBC: 5.46 MIL/uL (ref 4.22–5.81)
RDW: 12.5 % (ref 11.5–15.5)
WBC: 8.3 10*3/uL (ref 4.0–10.5)
nRBC: 0 % (ref 0.0–0.2)

## 2023-06-22 LAB — BASIC METABOLIC PANEL
Anion gap: 12 (ref 5–15)
BUN: 12 mg/dL (ref 8–23)
CO2: 25 mmol/L (ref 22–32)
Calcium: 9.1 mg/dL (ref 8.9–10.3)
Chloride: 100 mmol/L (ref 98–111)
Creatinine, Ser: 1.01 mg/dL (ref 0.61–1.24)
GFR, Estimated: >60 mL/min (ref >60)
Glucose, Bld: 124 mg/dL — ABNORMAL HIGH (ref 70–99)
Potassium: 3.4 mmol/L — ABNORMAL LOW (ref 3.5–5.1)
Sodium: 137 mmol/L (ref 135–145)

## 2023-06-22 LAB — CBG MONITORING, ED: Glucose-Capillary: 119 mg/dL — ABNORMAL HIGH (ref 70–99)

## 2023-06-22 MED ORDER — DIAZEPAM 5 MG/ML IJ SOLN: 2.0000 mg | Freq: Once | INTRAMUSCULAR | Status: DC

## 2023-06-22 MED ORDER — SODIUM CHLORIDE 0.9 % IV BOLUS: 500.0000 mL | Freq: Once | INTRAVENOUS | Status: DC

## 2023-06-22 NOTE — ED Provider Notes (Signed)
[facility] Provider Note    Event Date/Time   First MD Initiated Contact with Patient 06/22/23 2322     (approximate)   History   Dizziness   HPI  Caleb Arellano is a 72 y.o. male who presents to the ED from home with a chief complaint of dizziness which began around 8 PM tonight after standing up from a sitting position.  Reports sensation of room spinning.  Similar symptoms previously.  Denies headache, vision changes, neck pain, slurred speech, facial droop, extremity weakness/numbness/tingling.  Denies fever/chills, chest pain, shortness of breath, abdominal pain, nausea, vomiting or diarrhea.     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   Cirrhosis of liver without mention of alcohol    Gout, unspecified    Hypertension    Pure hyperglyceridemia    Seborrheic dermatitis    Unspecified hereditary and idiopathic peripheral neuropathy      Active Problem List   Patient Active Problem List   Diagnosis Date Noted   Anxiety 03/24/2023   Varicella zoster 12/08/2022   Thoracic back pain 11/05/2022   Morbid obesity (HCC) 10/15/2020   Palpitations 10/18/2017   Mood disorder (HCC) 03/30/2017   Advance directive discussed with patient 03/30/2017   Alcohol dependence in remission (HCC) 04/24/2014   Visit for preventive health examination 04/23/2014   Vertigo 06/21/2013   Actinic keratosis 02/11/2007   GOUT 02/08/2007   Alcoholic peripheral neuropathy (HCC) 02/08/2007   Essential hypertension, benign 02/08/2007   Hepatic cirrhosis (HCC) 02/08/2007     Past Surgical History  History reviewed. No pertinent surgical history.   Home Medications   Prior to Admission medications   Medication Sig Start Date End Date Taking? Authorizing Provider  ALPRAZolam Prudy Feeler) 0.5 MG tablet Take 1-2 tablets (0.5-1 mg total) by mouth 2 (two) times daily as needed for anxiety.  06/16/23   Karie Schwalbe, MD  folic acid (FOLVITE) 1 MG tablet TAKE 1 TABLET BY MOUTH EVERY DAY 10/07/20   Tillman Abide I, MD  hydrocortisone 2.5 % cream Apply topically 2 (two) times daily as needed. 11/05/22   Karie Schwalbe, MD  ketoconazole (NIZORAL) 2 % shampoo Apply 1 application topically 2 (two) times a week. Patient taking differently: Apply 1 application  topically as needed. 03/11/15   Karie Schwalbe, MD  meclizine (ANTIVERT) 12.5 MG tablet Take 1 tablet (12.5 mg total) by mouth 3 (three) times daily as needed for dizziness. 06/23/23   Irean Hong, MD  mometasone (ELOCON) 0.1 % cream Apply 1 Application topically daily. 05/17/23   [provider]  spironolactone (ALDACTONE) 50 MG tablet TAKE 1 TABLET BY MOUTH EVERY DAY Patient taking differently: Takes every other day 01/08/21   Karie Schwalbe, MD  thiamine (VITAMIN B-1) 100 MG tablet Take 100 mg by mouth daily.     [provider]     Allergies  Duloxetine   Family History   Family History  Problem Relation Age of Onset   Hypertension Mother    Diabetes Mother    Cancer Sister        1 sister with blood cancer, 1 with breast cancer   Stroke Brother    Heart disease Neg Hx      Physical Exam  Triage Vital Signs: ED Triage Vitals  Encounter Vitals Group     BP 06/22/23 2114 (!) 185/86  Systolic BP Percentile --      Diastolic BP Percentile --      Pulse Rate 06/22/23 2114 (!) 105     Resp 06/22/23 2114 16     Temp 06/22/23 2114 97.7 F (36.5 C)     Temp Source 06/22/23 2114 Oral     SpO2 06/22/23 2114 96 %     Weight 06/22/23 2108 265 lb (120.2 kg)     Height 06/22/23 2108 5\' 8"  (1.727 m)     Head Circumference --      Peak Flow --      Pain Score 06/22/23 2108 0     Pain Loc --      Pain Education --      Exclude from Growth Chart --     Updated Vital Signs: BP (!) 175/92 (BP Location: Right Arm)   Pulse 80   Temp 97.7 F (36.5 C) (Oral)   Resp 16   Ht 5\' 8"  (1.727  m)   Wt 120.2 kg   SpO2 100%   BMI 40.29 kg/m    General: Awake, anxious.  CV:  RRR.  Good peripheral perfusion.  Resp:  Normal effort.  CTAB. Abd:  Nontender.  No distention.  Other:  No carotid bruits.  Alert and oriented x 3.  CN II-XII grossly intact.  5/5 motor strength and sensation all extremities.  MAE x 4.   ED Results / Procedures / Treatments  Labs (all labs ordered are listed, but only abnormal results are displayed) Labs Reviewed  BASIC METABOLIC PANEL - Abnormal; Notable for the following components:      Result Value   Potassium 3.4 (*)    Glucose, Bld 124 (*)    All other components within normal limits  URINALYSIS, ROUTINE W REFLEX MICROSCOPIC - Abnormal; Notable for the following components:   Color, Urine YELLOW (*)    APPearance CLEAR (*)    All other components within normal limits  CBG MONITORING, ED - Abnormal; Notable for the following components:   Glucose-Capillary 119 (*)    All other components within normal limits  CBC  TROPONIN I (HIGH SENSITIVITY)     EKG  ED ECG REPORT I, Aashka Salomone J, the attending physician, personally viewed and interpreted this ECG.   Date: 06/23/2023  EKG Time: 2115  Rate: 109  Rhythm: sinus tachycardia  Axis: Normal  Intervals:none  ST&T Change: Nonspecific    RADIOLOGY I have independently visualized and interpreted patient's imaging study as well as noted the radiology interpretation:  CT head: No acute cardiopulmonary process  Official radiology report(s): CT Head Wo Contrast Result Date: 06/22/2023 CLINICAL DATA:  Dizziness EXAM: CT HEAD WITHOUT CONTRAST TECHNIQUE: Contiguous axial images were obtained from the base of the skull through the vertex without intravenous contrast. RADIATION DOSE REDUCTION: This exam was performed according to the departmental dose-optimization program which includes automated exposure control, adjustment of the mA and/or kV according to patient size and/or use of iterative  reconstruction technique. COMPARISON:  06/20/2013 CT head FINDINGS: Brain: No evidence of acute infarction, hemorrhage, mass, mass effect, or midline shift. No hydrocephalus or extra-axial fluid collection. Vascular: No hyperdense vessel. Skull: Negative for fracture or focal lesion. Sinuses/Orbits: Mild mucosal thickening in the ethmoid air cells. No acute finding in the orbits. Other: The mastoid air cells are well aerated. IMPRESSION: No acute intracranial process. No etiology is seen for the patient's symptoms. Electronically Signed   By: Wiliam Ke M.D.   On: 06/22/2023 23:52  PROCEDURES:  Critical Care performed: No  .1-3 Lead EKG Interpretation  Performed by: Irean Hong, MD Authorized by: Irean Hong, MD     Interpretation: normal     ECG rate:  95   ECG rate assessment: normal     Rhythm: sinus rhythm     Ectopy: none     Conduction: normal   Comments:     Patient placed on cardiac monitor to evaluate for arrhythmias    MEDICATIONS ORDERED IN ED: Medications - No data to display   IMPRESSION / MDM / ASSESSMENT AND PLAN / ED COURSE  I reviewed the triage vital signs and the nursing notes.                             72 year old male presenting with dizziness after standing up from seated position.  Differential diagnosis includes but is not limited to vertigo, orthostasis, CVA, ACS, etc.  I have personally reviewed patient's records and note a PCP office visit for mood disorder on 05/31/2023.  Patient's presentation is most consistent with acute presentation with potential threat to life or bodily function.  The patient is on the cardiac monitor to evaluate for evidence of arrhythmia and/or significant heart rate changes.  Laboratory results unremarkable.  Will add troponin and obtain CT head.  Obtain orthostatic vital signs.  Patient is very nervous at the thought of receiving IV fluids and Valium.  States it is time for him to take his nightly Xanax and would  prefer to do that instead.  Will let him take his nightly Xanax and reassess.  Clinical Course as of 06/23/23 2725  Wed Jun 23, 2023  0150 Patient was mildly orthostatic.  Currently feeling much better after taking his home Xanax.  Will refill meclizine prescription.  Will monitor his blood pressure at home.  Strict return precautions given.  Patient and spouse verbalized understanding and agree with plan of care. [JS]    Clinical Course User Index [JS] Irean Hong, MD     FINAL CLINICAL IMPRESSION(S) / ED DIAGNOSES   Final diagnoses:  Dizziness     Rx / DC Orders   ED Discharge Orders          Ordered    meclizine (ANTIVERT) 12.5 MG tablet  3 times daily PRN        06/23/23 0151             Note:  This document was prepared using Dragon voice recognition software and may include unintentional dictation errors.   Irean Hong, MD 06/23/23 [phone]

## 2023-06-22 NOTE — ED Triage Notes (Signed)
Pt to ED via POV c/o dizziness. Pt reports this started tonight. Feels like vertigo. Pt also says this has happened before. Pt A&Ox4, able to follow commands. Denies CP, SHOB, fevers

## 2023-06-22 NOTE — ED Notes (Signed)
Patient transported to CT 

## 2023-06-23 LAB — TROPONIN I (HIGH SENSITIVITY): Troponin I (High Sensitivity): 6 ng/L (ref <18)

## 2023-06-23 MED ORDER — MECLIZINE HCL 12.5 MG PO TABS
12.5000 mg | ORAL_TABLET | Freq: Three times a day (TID) | ORAL | 0 refills | Status: AC | PRN
Start: 2023-06-23 — End: ?

## 2023-06-23 NOTE — Discharge Instructions (Signed)
You may take Meclizine as needed for dizziness.  Drink plenty of fluids daily.  Log your blood pressure 3 times daily in the comfort of your own home and take these readings to your doctor.  Return to the ER for worsening symptoms, persistent vomiting, difficulty breathing or other concerns.

## 2023-07-02 ENCOUNTER — Other Ambulatory Visit: Payer: Self-pay | Admitting: Internal Medicine

## 2023-07-02 MED ORDER — ALPRAZOLAM 0.5 MG PO TABS: 0.5000 mg | ORAL_TABLET | Freq: Two times a day (BID) | ORAL | 0 refills | Status: DC | PRN

## 2023-07-05 ENCOUNTER — Telehealth: Payer: Self-pay

## 2023-07-05 NOTE — Progress Notes (Signed)
 Transition Care Management Unsuccessful Follow-up Telephone Call  Date of discharge and from where:  06/23/2023 [facility]  Attempts:  1st Attempt  Reason for unsuccessful TCM follow-up call:  Left voice message  Bryna Razavi [facility]  [facility] Resource Care Guide Direct Dial: [phone]  Website: [email or web address]

## 2023-07-06 ENCOUNTER — Encounter: Payer: Self-pay | Admitting: Internal Medicine

## 2023-07-06 ENCOUNTER — Ambulatory Visit (INDEPENDENT_AMBULATORY_CARE_PROVIDER_SITE_OTHER): Payer: Medicare HMO | Admitting: Internal Medicine

## 2023-07-06 VITALS — BP 164/88 | HR 87 | Temp 97.8°F | Ht 68.5 in | Wt 265.0 lb

## 2023-07-06 DIAGNOSIS — K7030 Alcoholic cirrhosis of liver without ascites: Secondary | ICD-10-CM

## 2023-07-06 DIAGNOSIS — I10 Essential (primary) hypertension: Secondary | ICD-10-CM | POA: Diagnosis not present

## 2023-07-06 DIAGNOSIS — F39 Unspecified mood [affective] disorder: Secondary | ICD-10-CM | POA: Diagnosis not present

## 2023-07-06 MED ORDER — SPIRONOLACTONE 50 MG PO TABS: 50.0000 mg | ORAL_TABLET | Freq: Every day | ORAL | 3 refills | Status: DC

## 2023-07-06 NOTE — Progress Notes (Signed)
 Subjective:    Patient ID: Caleb Arellano, male    DOB: [birth year], 73 y.o.   MRN: [id number]  HPI Here for ER follow up  Seen on 12/24---did have some panic feeling Also vertigo Labs okay and head CT benign  Knows his BP was spiking up (secondary)---then worries about dying, etc No chest pain Does hyperventilate when he gets panicky  Feels like he is not getting much out of counseling with [facility] Trying to get in with Dr Kim  Feels he can tell when BP is up---makes his vision blurry  Had stopped the spironolactone  since swelling was better  Current Outpatient Medications on File Prior to Visit  Medication Sig Dispense Refill   ALPRAZolam  (XANAX ) 0.5 MG tablet Take 1-2 tablets (0.5-1 mg total) by mouth 2 (two) times daily as needed for anxiety. 30 tablet 0   folic acid  (FOLVITE ) 1 MG tablet TAKE 1 TABLET BY MOUTH EVERY DAY 90 tablet 3   hydrocortisone  2.5 % cream Apply topically 2 (two) times daily as needed. 30 g 1   ketoconazole  (NIZORAL ) 2 % shampoo Apply 1 application topically 2 (two) times a week. (Patient taking differently: Apply 1 application  topically as needed.) 120 mL 11   mometasone (ELOCON) 0.1 % cream Apply 1 Application topically daily.     spironolactone  (ALDACTONE ) 50 MG tablet TAKE 1 TABLET BY MOUTH EVERY DAY (Patient taking differently: Takes every other day) 90 tablet 3   thiamine (VITAMIN B-1) 100 MG tablet Take 100 mg by mouth daily.      meclizine  (ANTIVERT ) 12.5 MG tablet Take 1 tablet (12.5 mg total) by mouth 3 (three) times daily as needed for dizziness. (Patient not taking: Reported on 07/06/2023) 21 tablet 0   No current facility-administered medications on file prior to visit.    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 No pertinent surgical history.  Family History  Problem Relation Age of Onset   Hypertension Mother    Diabetes Mother    Cancer Sister        1 sister with blood cancer, 1 with breast cancer   Stroke Brother    Heart disease Neg Hx     Social History   Socioeconomic History   Marital status: Married    Spouse name: Not on file   Number of children: 3   Years of education: Not on file   Highest education level: 12th grade  Occupational History   Occupation: disabled  Tobacco Use   Smoking status: Former    Types: Cigarettes    Passive exposure: Never   Smokeless tobacco: Never  Substance and Sexual Activity   Alcohol use: No    Comment: alcoholic, stopped recently   Drug use: No   Sexual activity: Not on file  Other Topics Concern   Not on file  Social History Narrative   No living will   Wife, then daughter Cathlean, would be medical decision maker   Would accept resuscitation   No prolonged tube feeds if cognitively unaware   Social Drivers of Health   Financial Resource Strain: High Risk (04/21/2023)  Overall Financial Resource Strain (CARDIA)    Difficulty of Paying Living Expenses: Very hard  Food Insecurity: No Food Insecurity (04/21/2023)   Hunger Vital Sign    Worried About Running Out of Food in the Last Year: Never true    Ran Out of Food in the Last Year: Never true  Transportation Needs: No Transportation Needs (04/21/2023)   PRAPARE - Administrator, Civil Service (Medical): No    Lack of Transportation (Non-Medical): No  Physical Activity: Unknown (04/21/2023)   Exercise Vital Sign    Days of Exercise per Week: Patient declined    Minutes of Exercise per Session: Not on file  Stress: Stress Concern Present (04/21/2023)   [employer] of Occupational Health - Occupational Stress Questionnaire     Feeling of Stress : Very much  Social Connections: Moderately Isolated (04/21/2023)   Social Connection and Isolation Panel [NHANES]    Frequency of Communication with Friends and Family: Once a week    Frequency of Social Gatherings with Friends and Family: Once a week    Attends Religious Services: Never    Database Administrator or Organizations: Yes    Attends Engineer, Structural: More than 4 times per year    Marital Status: Married  Catering Manager Violence: Not on file   Review of Systems Sleeping okay Appetite stable     Objective:   Physical Exam Constitutional:      Appearance: Normal appearance.  Cardiovascular:     Rate and Rhythm: Normal rate and regular rhythm.     Heart sounds: No murmur heard.    No gallop.  Pulmonary:     Effort: Pulmonary effort is normal.     Breath sounds: Normal breath sounds. No wheezing or rales.  Musculoskeletal:     Cervical back: Neck supple.  Lymphadenopathy:     Cervical: No cervical adenopathy.  Neurological:     Mental Status: He is alert.            Assessment & Plan:

## 2023-07-06 NOTE — Assessment & Plan Note (Signed)
 Ongoing anxiety and panic Trouble finding a tolerable daily med--so still using the alprazolam Will try to get him in with Dr Maryruth Bun

## 2023-07-06 NOTE — Assessment & Plan Note (Signed)
 BP Readings from Last 3 Encounters:  07/06/23 (!) 164/88  06/23/23 (!) 175/92  05/31/23 130/84   Will have him restart the spironolactone 50mg  daily

## 2023-07-06 NOTE — Assessment & Plan Note (Signed)
 No evidence of decompensation

## 2023-07-07 ENCOUNTER — Ambulatory Visit: Payer: Medicare HMO | Admitting: Psychology

## 2023-07-07 DIAGNOSIS — F418 Other specified anxiety disorders: Secondary | ICD-10-CM

## 2023-07-07 NOTE — Progress Notes (Signed)
 [facility] Counselor/Therapist Progress Note  Patient ID: Caleb Arellano, MRN: [id number]   Date: 07/07/23  Time Spent: 2:03 pm - 2:48 pm : 45 Minutes  Treatment Type: Individual Therapy.  Reported Symptoms: depression and anxiety.   Mental Status Exam: Appearance:  Casual     Behavior: Appropriate  Motor: Normal  Speech/Language:  Clear and Coherent  Affect: Congruent  Mood: anxious  Thought process: normal  Thought content:   WNL  Sensory/Perceptual disturbances:   WNL  Orientation: oriented to person, place, time/date, and situation  Attention: Good  Concentration: Good  Memory: WNL  Fund of knowledge:  Good  Insight:   Good  Judgment:  Good  Impulse Control: Good   Risk Assessment: Danger to Self:  No Self-injurious Behavior: No Danger to Others: No Duty to Warn:no Physical Aggression / Violence:No  Access to Firearms a concern: No  Gang Involvement:No   Subjective:   Caleb Arellano participated from home, via video and consented to treatment. Therapist participated from home office. Caleb Arellano reviewed the events of the past week. Caleb Arellano noted being referred to psychiatry, with Caleb Arellano. He noted his general reluctance to take an anti-depressant but is open to it. He noted a recent ED visit due to his blood pressure being quite high. He noted worrying that his blood pressure will get higher which increased his anxiety and led to him going to the ED. He is restarted his blood pressure medication. He was instructed to begin to measure his blood pressure 3x per day. Caleb Arellano noted often eschewing any interventions that do not being upon immediate results. We worked on exploring this in relation to tools we have reviewed for symptom management. Therapist validated Caleb Arellano's feelings and provided psycho-education regarding tools and interventions. We worked on exploring his expectations and the effect of these expectations on his mood, outlook, and general  functioning. Therapist noted Caleb Arellano's negative self-talk in various areas including mood, expectations for interventions, worsening of symptoms, and hyperfocus on mood and symptoms. Therapist provided psycho-education regarding self-talk and cognitive distortions and their effect on mood. Therapist encouraged Caleb Arellano to document his negative self-talk to be processed going forward and challenged. Therapist encouraged Caleb Arellano to be mindful of mood, work on engagement in enjoyable activities and self-care and to employ positive thinking and reasonable expectations. Therapist provided in-depth psycho-education regarding anxiety and depression. A follow-up was scheduled for continued treatment.   Interventions: CBT  Diagnosis:  Other specified anxiety disorders  Psychiatric Treatment: Yes , via PCP. See chart.    Treatment Plan:  Client Abilities/Strengths Caleb Arellano is intelligent, self-aware, and motivated for change.   Support System: Family  Client Treatment Preferences Outpatient Therapy  Client Statement of Needs Case would like to manage his symptoms, reconnect to normal routine, engage in enjoyable activities, improve frustration tolerance, learn about cycle of anxiety or depression, manage claustrophobia, improve coping, manage negative self-talk, proactive and reactive symptom management, and processing events and experiences.   Treatment Level Biweekly  Symptoms  Depression: loss of interest, feeling down, lethargy, poor appetite, feeling bad about self, trouble concentrating.   (Status: maintained) Anxiety: feeling anxious, difficulty managing worry, worrying about different things, trouble relaxing, restlessness, irritability, and feeling afraid as if something awful might happen.   (Status: maintained)  Goals:   Caleb Arellano experiences symptoms of anxiety and depression.   Treatment plan signed and available on s-drive:  No, pending o   Target Date: 04/26/24 Frequency: Weekly   Progress: 0 Modality: individual  Therapist will provide referrals for additional resources as appropriate.  Therapist will provide psycho-education regarding Caleb Arellano's diagnosis and corresponding treatment approaches and interventions. Licensed Clinical Social Worker, [place], LCSW will support the patient's ability to achieve the goals identified. will employ CBT, BA, Problem-solving, Solution Focused, Mindfulness,  coping skills, & other evidenced-based practices will be used to promote progress towards healthy functioning to help manage decrease symptoms associated with his diagnosis.   Reduce overall level, frequency, and intensity of the feelings of depression, anxiety and panic evidenced by decreased overall symptoms from 6 to 7 days/week to 0 to 1 days/week per client report for at least 3 consecutive months. Verbally express understanding of the relationship between feelings of depression, anxiety and their impact on thinking patterns and behaviors. Verbalize an understanding of the role that distorted thinking plays in creating fears, excessive worry, and ruminations.    (Caleb Arellano participated in the creation of the treatment plan)    Caleb Mullet, LCSW

## 2023-07-08 ENCOUNTER — Encounter: Payer: Self-pay | Admitting: Internal Medicine

## 2023-07-09 ENCOUNTER — Ambulatory Visit: Payer: Self-pay | Admitting: Internal Medicine

## 2023-07-09 MED ORDER — VALSARTAN 80 MG PO TABS: 80.0000 mg | ORAL_TABLET | Freq: Every day | ORAL | 3 refills | Status: DC

## 2023-07-09 NOTE — Telephone Encounter (Signed)
 Copied from CRM [id number]. Topic: Clinical - Red Word Triage >> Jul 09, 2023  1:03 PM Viola F wrote: Patient blood pressue 204/104 as of 12:50pm today  Chief Complaint: high BP Symptoms: Dizzy but always somewhat dizzy,  flushed, anxiety, anxious,  Frequency: started today Pertinent Negatives: Patient denies chest or SOB Disposition: [] ED /[] Urgent Care (no appt availability in office) / [] Appointment(In office/virtual)/ []  [facility]/ [] Home Care/ [] Refused Recommended Disposition /[] [facility]/ []  Follow-up with PCP Additional Notes: pt has been sending messages to PCP through mychart & no response.  Last BP 204/104 at 12:50pm: nurse advised pt to go to ED now: pt verbalized understanding and stated going to ED now.  Reason for Disposition  [1] Systolic BP  >= 160 OR Diastolic >= 100 AND [2] cardiac (e.g., breathing difficulty, chest pain) or neurologic symptoms (e.g., new-onset blurred or double vision, unsteady gait)  Answer Assessment - Initial Assessment Questions 1. BLOOD PRESSURE: What is the blood pressure? Did you take at least two measurements 5 minutes apart?     197/99 this morning   204/104 @12 :50 2. ONSET: When did you take your blood pressure?     today 3. HOW: How did you take your blood pressure? (e.g., automatic home BP monitor, visiting nurse)     automatic 4. HISTORY: Do you have a history of high blood pressure? 175/84 5. MEDICINES: Are you taking any medicines for blood pressure? Have you missed any doses recently?   Zanxax - took 1/2 pill (therefore took 0.25 mg)  6. OTHER SYMPTOMS: Do you have any symptoms? (e.g., blurred vision, chest pain, difficulty breathing, headache, weakness)     Dizzy but always somewhat dizzy, having flushed, anxiety, anxious,  7. PREGNANCY: Is there any chance you are pregnant? When was your last menstrual period?     N/a  Protocols used: Blood Pressure - High-A-AH

## 2023-07-09 NOTE — Telephone Encounter (Signed)
 Spoke with Dr. Letvak about this patient. He is going to send in a new prescription for him. Please see mychart message encounter.  Spoke with pt and made him aware of Dr. Marval response. Pt did not want to go to the ER if he didn't have to. Pt was appreciative of my call and Dr. Marval quick response. Nothing further was needed.

## 2023-07-19 ENCOUNTER — Other Ambulatory Visit: Payer: Self-pay | Admitting: Internal Medicine

## 2023-07-19 MED ORDER — ALPRAZOLAM 0.5 MG PO TABS: 0.5000 mg | ORAL_TABLET | Freq: Two times a day (BID) | ORAL | 0 refills | Status: DC | PRN

## 2023-07-22 ENCOUNTER — Encounter: Payer: Self-pay | Admitting: Internal Medicine

## 2023-07-22 DIAGNOSIS — F410 Panic disorder [episodic paroxysmal anxiety] without agoraphobia: Secondary | ICD-10-CM

## 2023-07-26 NOTE — Telephone Encounter (Signed)
Copied from CRM [id number]. Topic: Referral - Status >> Jul 26, 2023  9:51 AM Kathryne Eriksson wrote: Reason for CRM: [facility], "Dr. Maryruth Bun"  Dr. Armond Hang office called to let Dr. Alphonsus Sias know they received a recent referral for pt. Office stated they don't accept pt's insurance.

## 2023-07-29 ENCOUNTER — Ambulatory Visit: Payer: Medicare HMO | Admitting: Psychology

## 2023-08-02 ENCOUNTER — Other Ambulatory Visit: Payer: Self-pay | Admitting: Internal Medicine

## 2023-08-02 MED ORDER — ALPRAZOLAM 0.5 MG PO TABS: 0.5000 mg | ORAL_TABLET | Freq: Two times a day (BID) | ORAL | 0 refills | Status: DC | PRN

## 2023-08-02 NOTE — Telephone Encounter (Signed)
Last filled 07-19-23 #30 Last OV 07-06-23 Next OV 08-30-23 [facility]

## 2023-08-04 DIAGNOSIS — H6063 Unspecified chronic otitis externa, bilateral: Secondary | ICD-10-CM | POA: Diagnosis not present

## 2023-08-04 DIAGNOSIS — H6123 Impacted cerumen, bilateral: Secondary | ICD-10-CM | POA: Diagnosis not present

## 2023-08-16 ENCOUNTER — Other Ambulatory Visit: Payer: Self-pay | Admitting: Internal Medicine

## 2023-08-16 ENCOUNTER — Telehealth: Payer: Self-pay | Admitting: Internal Medicine

## 2023-08-16 MED ORDER — ALPRAZOLAM 0.5 MG PO TABS: 0.5000 mg | ORAL_TABLET | Freq: Two times a day (BID) | ORAL | 0 refills | Status: DC | PRN

## 2023-08-16 NOTE — Telephone Encounter (Signed)
 Copied from CRM [id number]. Topic: Clinical - Prescription Issue >> Aug 16, 2023  9:41 AM Denese Killings wrote: Reason for CRM: Patient wants to know why  ALPRAZolam Prudy Feeler) 0.5 MG tablet was denied, I advised patient that the reason was because he requested a refill too soon, Patient stated that he has 4 tablets left and it's been 14 days since he got the prescription.

## 2023-08-16 NOTE — Telephone Encounter (Signed)
 Last Rx was for #30 on 07-19-23. I denied it as refill too soon as his last refill was

## 2023-08-16 NOTE — Telephone Encounter (Signed)
 He is allowed up to 4 per day----so I refilled the Rx (and increased to #60) He has had trouble with other meds so has to stick with this

## 2023-08-16 NOTE — Telephone Encounter (Signed)
 Spoke to pt. He is not out of medication. Has about 3 left.

## 2023-08-16 NOTE — Addendum Note (Signed)
 Addended by: Tillman Abide I on: 08/16/2023 11:25 AM   Modules accepted: Orders

## 2023-08-30 ENCOUNTER — Encounter: Payer: Self-pay | Admitting: Internal Medicine

## 2023-08-30 ENCOUNTER — Ambulatory Visit (INDEPENDENT_AMBULATORY_CARE_PROVIDER_SITE_OTHER): Payer: Medicare HMO | Admitting: Internal Medicine

## 2023-08-30 VITALS — BP 170/100 | HR 102 | Temp 98.1°F | Ht 68.5 in | Wt 266.0 lb

## 2023-08-30 DIAGNOSIS — F39 Unspecified mood [affective] disorder: Secondary | ICD-10-CM | POA: Diagnosis not present

## 2023-08-30 DIAGNOSIS — I10 Essential (primary) hypertension: Secondary | ICD-10-CM

## 2023-08-30 MED ORDER — VALSARTAN 160 MG PO TABS: 160.0000 mg | ORAL_TABLET | Freq: Every day | ORAL | 3 refills | Status: DC

## 2023-08-30 MED ORDER — BUSPIRONE HCL 5 MG PO TABS: 2.5000 mg | ORAL_TABLET | Freq: Two times a day (BID) | ORAL | 3 refills | Status: DC

## 2023-08-30 NOTE — Patient Instructions (Signed)
 Please increase the valsartan to 160mg  daily.  Start the buspirone at 1/2 tab daily---2.5mg . if no problems after 2 days, increase to 2.5mg  twice a day. If no problems after a week, you can increase to 1 tab (5mg ) twice a day

## 2023-08-30 NOTE — Progress Notes (Signed)
 Subjective:    Patient ID: Caleb Arellano, male    DOB: 1951/06/26, 73 y.o.   MRN: [id number]  HPI Here for follow up of mood issues  Stopped taking BP at home  Usually 200/100---and it got him into panic attack Feels better on the valsartan  Doing the xanax twice a day typically--that is keeping his mood fairly level Will cut them in half at times Hasn't been able to get in with psychiatrist Not having the severe panic now--but still claustrophic  Shops for groceries, goes out Not a social person---but goes out once in a while  No chest pain or SOB No dizziness or syncope--but gets rare balance feeling (like he is falling). No actual vertigo No edema  Current Outpatient Medications on File Prior to Visit  Medication Sig Dispense Refill   ALPRAZolam (XANAX) 0.5 MG tablet Take 1-2 tablets (0.5-1 mg total) by mouth 2 (two) times daily as needed for anxiety. 60 tablet 0   folic acid (FOLVITE) 1 MG tablet TAKE 1 TABLET BY MOUTH EVERY DAY 90 tablet 3   hydrocortisone 2.5 % cream Apply topically 2 (two) times daily as needed. 30 g 1   ketoconazole (NIZORAL) 2 % shampoo Apply 1 application topically 2 (two) times a week. (Patient taking differently: Apply 1 application  topically as needed.) 120 mL 11   meclizine (ANTIVERT) 12.5 MG tablet Take 1 tablet (12.5 mg total) by mouth 3 (three) times daily as needed for dizziness. 21 tablet 0   mometasone (ELOCON) 0.1 % cream Apply 1 Application topically daily.     spironolactone (ALDACTONE) 50 MG tablet Take 1 tablet (50 mg total) by mouth daily. 90 tablet 3   thiamine (VITAMIN B-1) 100 MG tablet Take 100 mg by mouth daily.      valsartan (DIOVAN) 80 MG tablet Take 1 tablet (80 mg total) by mouth daily. 90 tablet 3   No current facility-administered medications on file prior to visit.    Allergies  Allergen Reactions   Duloxetine Other (See Comments)    Past Medical History:  Diagnosis Date   Ataxia 04/07   hepatic enceph/ peripheral  neuropathy   Cirrhosis of liver without mention of alcohol    Gout, unspecified    Hypertension    Pure hyperglyceridemia    Seborrheic dermatitis    Unspecified hereditary and idiopathic peripheral neuropathy     History reviewed. No pertinent surgical history.  Family History  Problem Relation Age of Onset   Hypertension Mother    Diabetes Mother    Cancer Sister        1 sister with blood cancer, 1 with breast cancer   Stroke Brother    Heart disease Neg Hx     Social History   Socioeconomic History   Marital status: Married    Spouse name: Not on file   Number of children: 3   Years of education: Not on file   Highest education level: Some college, no degree  Occupational History   Occupation: disabled  Tobacco Use   Smoking status: Former    Types: Cigarettes    Passive exposure: Never   Smokeless tobacco: Never  Substance and Sexual Activity   Alcohol use: No    Comment: alcoholic, stopped recently   Drug use: No   Sexual activity: Not on file  Other Topics Concern   Not on file  Social History Narrative   No living will   Wife, then daughter Scarlette Calico, would be medical  decision maker   Would accept resuscitation   No prolonged tube feeds if cognitively unaware   Social Drivers of Health   Financial Resource Strain: Medium Risk (08/29/2023)   Overall Financial Resource Strain (CARDIA)    Difficulty of Paying Living Expenses: Somewhat hard  Food Insecurity: No Food Insecurity (08/29/2023)   Hunger Vital Sign    Worried About Running Out of Food in the Last Year: Never true    Ran Out of Food in the Last Year: Never true  Transportation Needs: No Transportation Needs (08/29/2023)   PRAPARE - Administrator, Civil Service (Medical): No    Lack of Transportation (Non-Medical): No  Physical Activity: Unknown (08/29/2023)   Exercise Vital Sign    Days of Exercise per Week: 1 day    Minutes of Exercise per Session: Patient declined  Stress: Patient  Declined (08/29/2023)   [employer] of Occupational Health - Occupational Stress Questionnaire    Feeling of Stress : Patient declined  Social Connections: Unknown (08/29/2023)   Social Connection and Isolation Panel [NHANES]    Frequency of Communication with Friends and Family: Twice a week    Frequency of Social Gatherings with Friends and Family: Patient declined    Attends Religious Services: 1 to 4 times per year    Active Member of [employer] or Organizations: Patient declined    Attends Engineer, structural: More than 4 times per year    Marital Status: Married  Catering manager Violence: Not on file   Review of Systems Sleeps okay Appetite is down some--but okay     Objective:   Physical Exam Constitutional:      Appearance: Normal appearance.  Cardiovascular:     Rate and Rhythm: Normal rate and regular rhythm.     Heart sounds: No murmur heard.    No gallop.  Pulmonary:     Effort: Pulmonary effort is normal.     Breath sounds: Normal breath sounds. No wheezing or rales.  Musculoskeletal:     Cervical back: Neck supple.     Right lower leg: No edema.     Left lower leg: No edema.  Lymphadenopathy:     Cervical: No cervical adenopathy.  Neurological:     Mental Status: He is alert.  Psychiatric:     Comments: Only slightly anxious here No depression            Assessment & Plan:

## 2023-08-30 NOTE — Assessment & Plan Note (Signed)
 BP Readings from Last 3 Encounters:  08/30/23 (!) 174/84  07/06/23 (!) 164/88  06/23/23 (!) 175/92   Better with the valsartan Will double the dose to 160mg  daily

## 2023-08-30 NOTE — Assessment & Plan Note (Addendum)
 Still with panic issues---better control with the alprazolam Will try buspirone 5mg --- 1/2-1 bid to start and if tolerates, we can titrate Still working to try to get him in with a psychiatrist

## 2023-09-14 ENCOUNTER — Other Ambulatory Visit: Payer: Self-pay | Admitting: Internal Medicine

## 2023-09-15 MED ORDER — ALPRAZOLAM 0.5 MG PO TABS: 0.5000 mg | ORAL_TABLET | Freq: Two times a day (BID) | ORAL | 0 refills | Status: DC | PRN

## 2023-09-15 NOTE — Telephone Encounter (Signed)
 Last filled 08-16-23 #60 Last OV 08-30-23 Next OV 09-30-23 [facility]

## 2023-09-30 ENCOUNTER — Encounter: Payer: Self-pay | Admitting: Internal Medicine

## 2023-09-30 ENCOUNTER — Ambulatory Visit (INDEPENDENT_AMBULATORY_CARE_PROVIDER_SITE_OTHER): Admitting: Internal Medicine

## 2023-09-30 VITALS — BP 158/84 | HR 94 | Temp 97.7°F | Ht 68.5 in | Wt 266.0 lb

## 2023-09-30 DIAGNOSIS — I10 Essential (primary) hypertension: Secondary | ICD-10-CM

## 2023-09-30 DIAGNOSIS — F39 Unspecified mood [affective] disorder: Secondary | ICD-10-CM

## 2023-09-30 NOTE — Progress Notes (Signed)
 Subjective:    Patient ID: Caleb Arellano, male    DOB: 02-27-1951, 73 y.o.   MRN: [id number]  HPI Here for follow up of anxiety and HTN  Things are better Finds the buspirone will keep him up at night if he takes in the evening So just taking 2.5 mg in the morning Still will have the anxiety work up---needs the xanax just about daily (and sometimes more)-- uses 1/2 tab and may take as much as 0.75mg  a day  Taking valsartan daily but stopped the spironolactone Not checking BP  Current Outpatient Medications on File Prior to Visit  Medication Sig Dispense Refill   ALPRAZolam (XANAX) 0.5 MG tablet Take 1-2 tablets (0.5-1 mg total) by mouth 2 (two) times daily as needed for anxiety. 60 tablet 0   busPIRone (BUSPAR) 5 MG tablet Take 0.5-1 tablets (2.5-5 mg total) by mouth 2 (two) times daily. 60 tablet 3   folic acid (FOLVITE) 1 MG tablet TAKE 1 TABLET BY MOUTH EVERY DAY 90 tablet 3   hydrocortisone 2.5 % cream Apply topically 2 (two) times daily as needed. 30 g 1   ketoconazole (NIZORAL) 2 % shampoo Apply 1 application topically 2 (two) times a week. (Patient taking differently: Apply 1 application  topically as needed.) 120 mL 11   meclizine (ANTIVERT) 12.5 MG tablet Take 1 tablet (12.5 mg total) by mouth 3 (three) times daily as needed for dizziness. 21 tablet 0   mometasone (ELOCON) 0.1 % cream Apply 1 Application topically daily.     thiamine (VITAMIN B-1) 100 MG tablet Take 100 mg by mouth daily.      valsartan (DIOVAN) 160 MG tablet Take 1 tablet (160 mg total) by mouth daily. 90 tablet 3   spironolactone (ALDACTONE) 50 MG tablet Take 1 tablet (50 mg total) by mouth daily. (Patient not taking: Reported on 09/30/2023) 90 tablet 3   No current facility-administered medications on file prior to visit.    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 No pertinent surgical history.  Family History  Problem Relation Age of Onset   Hypertension Mother    Diabetes Mother    Cancer Sister        1 sister with blood cancer, 1 with breast cancer   Stroke Brother    Heart disease Neg Hx     Social History   Socioeconomic History   Marital status: Married    Spouse name: Not on file   Number of children: 3   Years of education: Not on file   Highest education level: Some college, no degree  Occupational History   Occupation: disabled  Tobacco Use   Smoking status: Former    Types: Cigarettes    Passive exposure: Never   Smokeless tobacco: Never  Substance and Sexual Activity   Alcohol use: No    Comment: alcoholic, stopped recently   Drug use: No   Sexual activity: Not on file  Other Topics Concern   Not on file  Social History Narrative   No living will   Wife, then daughter Scarlette Calico, would be medical decision maker  Would accept resuscitation   No prolonged tube feeds if cognitively unaware   Social Drivers of Health   Financial Resource Strain: Medium Risk (08/29/2023)   Overall Financial Resource Strain (CARDIA)    Difficulty of Paying Living Expenses: Somewhat hard  Food Insecurity: No Food Insecurity (08/29/2023)   Hunger Vital Sign    Worried About Running Out of Food in the Last Year: Never true    Ran Out of Food in the Last Year: Never true  Transportation Needs: No Transportation Needs (08/29/2023)   PRAPARE - Administrator, Civil Service (Medical): No    Lack of Transportation (Non-Medical): No  Physical Activity: Unknown (08/29/2023)   Exercise Vital Sign    Days of Exercise per Week: 1 day    Minutes of Exercise per Session: Patient declined  Stress: Patient Declined (08/29/2023)    [employer] of Occupational Health - Occupational Stress Questionnaire    Feeling of Stress : Patient declined  Social Connections: Unknown (08/29/2023)   Social Connection and Isolation Panel [NHANES]    Frequency of Communication with Friends and Family: Twice a week    Frequency of Social Gatherings with Friends and Family: Patient declined    Attends Religious Services: 1 to 4 times per year    Active Member of [employer] or Organizations: Patient declined    Attends Engineer, structural: More than 4 times per year    Marital Status: Married  Catering manager Violence: Not on file   Review of Systems Sleeps okay Appetite is fine     Objective:   Physical Exam Constitutional:      Appearance: Normal appearance.  Cardiovascular:     Rate and Rhythm: Normal rate and regular rhythm.     Heart sounds: No murmur heard.    No gallop.  Pulmonary:     Effort: Pulmonary effort is normal.     Breath sounds: Normal breath sounds. No wheezing or rales.  Musculoskeletal:     Cervical back: Neck supple.     Right lower leg: No edema.     Left lower leg: No edema.  Lymphadenopathy:     Cervical: No cervical adenopathy.  Neurological:     Mental Status: He is alert.  Psychiatric:        Mood and Affect: Mood normal.        Behavior: Behavior normal.            Assessment & Plan:

## 2023-09-30 NOTE — Assessment & Plan Note (Signed)
 BP Readings from Last 3 Encounters:  09/30/23 (!) 158/84  08/30/23 (!) 170/100  07/06/23 (!) 164/88   Better on the higher valsartan Asked him to restart the spironolactone

## 2023-09-30 NOTE — Assessment & Plan Note (Signed)
 Ongoing significant anxiety--including severe claustrophobia that he never had before (can't get in an elevator) Asked him to try to increase the buspar--take second dose at lunch and titrate up Using the xanax--typically 1-2 per day of the 0.5mg  (half at a time)

## 2023-09-30 NOTE — Patient Instructions (Signed)
 Please try to increase the buspirone. Try a second 2.5mg  dose around lunchtime. If you tolerate that, try increasing the morning dose to 5mg  ---and then the second dose also up to 5mg  (do this over 1-2 weeks). Add back the spironolactone and continue the valsartan

## 2023-10-11 ENCOUNTER — Other Ambulatory Visit: Payer: Self-pay | Admitting: Internal Medicine

## 2023-10-11 MED ORDER — ALPRAZOLAM 0.5 MG PO TABS
0.5000 mg | ORAL_TABLET | Freq: Two times a day (BID) | ORAL | 0 refills | Status: DC | PRN
Start: 2023-10-11 — End: 2023-11-10

## 2023-10-11 NOTE — Telephone Encounter (Signed)
 Last filled 09-15-23 #60 Last OV 09-30-23 Next OV 12-30-23 [facility]

## 2023-10-27 DIAGNOSIS — H6123 Impacted cerumen, bilateral: Secondary | ICD-10-CM | POA: Diagnosis not present

## 2023-10-27 DIAGNOSIS — H6063 Unspecified chronic otitis externa, bilateral: Secondary | ICD-10-CM | POA: Diagnosis not present

## 2023-11-10 ENCOUNTER — Other Ambulatory Visit: Payer: Self-pay | Admitting: Internal Medicine

## 2023-11-10 MED ORDER — ALPRAZOLAM 0.5 MG PO TABS
0.5000 mg | ORAL_TABLET | Freq: Two times a day (BID) | ORAL | 0 refills | Status: DC | PRN
Start: 2023-11-10 — End: 2023-12-03

## 2023-11-10 NOTE — Telephone Encounter (Signed)
 Last filled 10-11-23 #60 Last OV 09-30-23 Next OV 12-30-23 [facility]

## 2023-12-01 ENCOUNTER — Encounter: Payer: Self-pay | Admitting: Internal Medicine

## 2023-12-03 ENCOUNTER — Other Ambulatory Visit: Payer: Self-pay | Admitting: Internal Medicine

## 2023-12-03 MED ORDER — ALPRAZOLAM 0.5 MG PO TABS: 0.5000 mg | ORAL_TABLET | Freq: Two times a day (BID) | ORAL | 0 refills | Status: DC | PRN

## 2023-12-03 NOTE — Telephone Encounter (Signed)
 Copied from CRM [id number]. Topic: Clinical - Medication Refill >> Dec 03, 2023 10:26 AM Aisha D wrote: Medication: ALPRAZolam  (XANAX ) 0.5 MG tablet  Has the patient contacted their pharmacy? No (Agent: If no, request that the patient contact the pharmacy for the refill. If patient does not wish to contact the pharmacy document the reason why and proceed with request.) (Agent: If yes, when and what did the pharmacy advise?)  This is the patient's preferred pharmacy:  [facility] [id number] [place], [place] - [address] [place] [address] Phone: [phone] Fax: [phone]  Is this the correct pharmacy for this prescription? Yes If no, delete pharmacy and type the correct one.   Has the prescription been filled recently? No  Is the patient out of the medication? No  Has the patient been seen for an appointment in the last year OR does the patient have an upcoming appointment? Yes  Can we respond through MyChart? Yes  Agent: Please be advised that Rx refills may take up to 3 business days. We ask that you follow-up with your pharmacy.

## 2023-12-03 NOTE — Telephone Encounter (Signed)
 Copied from CRM [id number]. Topic: Clinical - Medication Question >> Dec 03, 2023 10:28 AM Bambi Bonine D wrote: Reason for CRM: Pt submitted a medication refill request for the ALPRAZolam  (XANAX ) 0.5 MG tablet and would for the medication to be approved today if possible.

## 2023-12-27 ENCOUNTER — Other Ambulatory Visit: Payer: Self-pay | Admitting: Family

## 2023-12-30 ENCOUNTER — Telehealth: Payer: Self-pay | Admitting: Internal Medicine

## 2023-12-30 ENCOUNTER — Ambulatory Visit: Admitting: Internal Medicine

## 2023-12-30 ENCOUNTER — Encounter: Payer: Self-pay | Admitting: Internal Medicine

## 2023-12-30 ENCOUNTER — Ambulatory Visit: Payer: Self-pay | Admitting: Internal Medicine

## 2023-12-30 VITALS — BP 152/70 | HR 118 | Temp 97.7°F | Resp 20 | Ht 68.5 in | Wt 263.2 lb

## 2023-12-30 DIAGNOSIS — F1021 Alcohol dependence, in remission: Secondary | ICD-10-CM

## 2023-12-30 DIAGNOSIS — K7030 Alcoholic cirrhosis of liver without ascites: Secondary | ICD-10-CM

## 2023-12-30 DIAGNOSIS — I10 Essential (primary) hypertension: Secondary | ICD-10-CM

## 2023-12-30 DIAGNOSIS — F39 Unspecified mood [affective] disorder: Secondary | ICD-10-CM

## 2023-12-30 DIAGNOSIS — Z0000 Encounter for general adult medical examination without abnormal findings: Secondary | ICD-10-CM

## 2023-12-30 DIAGNOSIS — G621 Alcoholic polyneuropathy: Secondary | ICD-10-CM

## 2023-12-30 LAB — HEPATIC FUNCTION PANEL
ALT: 19 U/L (ref 0–53)
AST: 19 U/L (ref 0–37)
Albumin: 4.5 g/dL (ref 3.5–5.2)
Alkaline Phosphatase: 61 U/L (ref 39–117)
Bilirubin, Direct: 0.1 mg/dL (ref 0.0–0.3)
Total Bilirubin: 0.4 mg/dL (ref 0.2–1.2)
Total Protein: 7.5 g/dL (ref 6.0–8.3)

## 2023-12-30 LAB — CBC
HCT: 46.8 % (ref 39.0–52.0)
Hemoglobin: 15.6 g/dL (ref 13.0–17.0)
MCHC: 33.3 g/dL (ref 30.0–36.0)
MCV: 92.5 fl (ref 78.0–100.0)
Platelets: 272 10*3/uL (ref 150.0–400.0)
RBC: 5.07 Mil/uL (ref 4.22–5.81)
RDW: 13 % (ref 11.5–15.5)
WBC: 8 10*3/uL (ref 4.0–10.5)

## 2023-12-30 LAB — RENAL FUNCTION PANEL
Albumin: 4.5 g/dL (ref 3.5–5.2)
BUN: 19 mg/dL (ref 6–23)
CO2: 26 meq/L (ref 19–32)
Calcium: 9.5 mg/dL (ref 8.4–10.5)
Chloride: 101 meq/L (ref 96–112)
Creatinine, Ser: 1.02 mg/dL (ref 0.40–1.50)
GFR: 73.01 mL/min (ref >60.00)
Glucose, Bld: 130 mg/dL — ABNORMAL HIGH (ref 70–99)
Phosphorus: 2.9 mg/dL (ref 2.3–4.6)
Potassium: 4.1 meq/L (ref 3.5–5.1)
Sodium: 137 meq/L (ref 135–145)

## 2023-12-30 MED ORDER — ALPRAZOLAM 0.5 MG PO TABS: 0.5000 mg | ORAL_TABLET | Freq: Two times a day (BID) | ORAL | 0 refills | Status: DC | PRN

## 2023-12-30 MED ORDER — BUSPIRONE HCL 5 MG PO TABS: 2.5000 mg | ORAL_TABLET | Freq: Every day | ORAL | 3 refills | Status: DC

## 2023-12-30 NOTE — Assessment & Plan Note (Addendum)
 BP Readings from Last 3 Encounters:  12/30/23 (!) 152/70  09/30/23 (!) 158/84  08/30/23 (!) 170/100   This is acceptable for him with valsartan  160mg  daily Has always had component of white coat HTN On the spironolactone  as well

## 2023-12-30 NOTE — Assessment & Plan Note (Signed)
 Ongoing anxiety Does okay with xanax  and low dose buspirone  Still looking for psychiatrist

## 2023-12-30 NOTE — Assessment & Plan Note (Signed)
 Compensated off the alcohol Recommended ultrasound for [facility] screening--he asks to defer

## 2023-12-30 NOTE — Assessment & Plan Note (Signed)
 BMI 39 with cirrhosis, HTN, gout, mood issues Discussed fitness

## 2023-12-30 NOTE — Assessment & Plan Note (Signed)
 Still on thiamine Uses walking stick/cane prn

## 2023-12-30 NOTE — Telephone Encounter (Signed)
 Pt asked if Dr. KANDICE would accept him as a pt since Dr. Jimmy is retiring? Per Dr. Jimmy, pt needs a 50month f/u in Jan 2026. Is this toc okay? Please advise. Pt requested a mychart message be sent if Dr. KANDICE approves transfer or not. Call back # [phone]

## 2023-12-30 NOTE — Telephone Encounter (Addendum)
 Ok to schedule recommended appt and transfer care

## 2023-12-30 NOTE — Assessment & Plan Note (Signed)
 Remains abstinent

## 2023-12-30 NOTE — Progress Notes (Signed)
 Subjective:    Patient ID: Caleb Arellano, male    DOB: 1951/06/13, 73 y.o.   MRN: [id number]  HPI Here for physical  Still gets anxiety Mostly able to control---uses the xanax  regularly Takes 1/2-1 buspirone  in the morning  Continues to be abstinent from alcohol  Gets dried out from the spironolactone  Now taking 1/2 daily--works better for him---but sometimes takes a full one No edema No abdominal swelling No longer having troubling nocturia  Weight is fairly stable BMI still over 34  Wife is on meds for cardiac blockage Daughter doing better  Current Outpatient Medications on File Prior to Visit  Medication Sig Dispense Refill   ALPRAZolam  (XANAX ) 0.5 MG tablet Take 1-2 tablets (0.5-1 mg total) by mouth 2 (two) times daily as needed for anxiety. 60 tablet 0   chlorhexidine (PERIDEX) 0.12 % solution Use as directed 5 mLs in the mouth or throat 2 (two) times daily.     folic acid  (FOLVITE ) 1 MG tablet TAKE 1 TABLET BY MOUTH EVERY DAY 90 tablet 3   hydrocortisone  2.5 % cream Apply topically 2 (two) times daily as needed. 30 g 1   ketoconazole  (NIZORAL ) 2 % shampoo Apply 1 application topically 2 (two) times a week. 120 mL 11   meclizine  (ANTIVERT ) 12.5 MG tablet Take 1 tablet (12.5 mg total) by mouth 3 (three) times daily as needed for dizziness. 21 tablet 0   mometasone (ELOCON) 0.1 % cream Apply 1 Application topically daily.     thiamine (VITAMIN B-1) 100 MG tablet Take 100 mg by mouth daily.      valsartan  (DIOVAN ) 160 MG tablet Take 1 tablet (160 mg total) by mouth daily. 90 tablet 3   No current facility-administered medications on file prior to visit.    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 No pertinent surgical history.  Family History  Problem Relation Age of Onset   Hypertension Mother    Diabetes Mother    Cancer Sister        1 sister with blood cancer, 1 with breast cancer   Stroke Brother    Heart disease Neg Hx     Social History   Socioeconomic History   Marital status: Married    Spouse name: Not on file   Number of children: 3   Years of education: Not on file   Highest education level: 12th grade  Occupational History   Occupation: Delivering cars    Comment: retired  Tobacco Use   Smoking status: Former    Types: Cigarettes    Passive exposure: Never   Smokeless tobacco: Never  Substance and Sexual Activity   Alcohol use: No    Comment: alcoholic, stopped recently   Drug use: No   Sexual activity: Not on file  Other Topics Concern   Not on file  Social History Narrative   No living will   Wife, then daughter Cathlean, would be medical decision maker   Would accept resuscitation   No prolonged tube feeds if cognitively unaware   Social Drivers of Health   Financial Resource Strain: Low Risk  (  12/27/2023)   Overall Financial Resource Strain (CARDIA)    Difficulty of Paying Living Expenses: Not very hard  Food Insecurity: No Food Insecurity (12/27/2023)   Hunger Vital Sign    Worried About Running Out of Food in the Last Year: Never true    Ran Out of Food in the Last Year: Never true  Transportation Needs: No Transportation Needs (12/27/2023)   PRAPARE - Administrator, Civil Service (Medical): No    Lack of Transportation (Non-Medical): No  Physical Activity: Inactive (12/27/2023)   Exercise Vital Sign    Days of Exercise per Week: 0 days    Minutes of Exercise per Session: Not on file  Stress: Patient Declined (08/29/2023)   [employer] of Occupational Health - Occupational Stress Questionnaire    Feeling of Stress : Patient declined   Social Connections: Unknown (12/27/2023)   Social Connection and Isolation Panel    Frequency of Communication with Friends and Family: Once a week    Frequency of Social Gatherings with Friends and Family: Patient declined    Attends Religious Services: Patient declined    Database administrator or Organizations: No    Attends Engineer, structural: Not on file    Marital Status: Married  Catering manager Violence: Not on file   Review of Systems  Constitutional:  Negative for fatigue and unexpected weight change.       Wears seat belt  HENT:  Positive for hearing loss and tinnitus.        Considering hearing aides Needs ongoing dental work  Eyes:        Needs new prescription---plans to go back to Dr Portia  Respiratory:  Negative for cough, chest tightness and shortness of breath.   Cardiovascular:  Positive for palpitations. Negative for leg swelling.       Occ twinges of left chest pain--from the shingles  Gastrointestinal:  Negative for blood in stool and constipation.       No heartburn  Endocrine: Negative for polydipsia and polyuria.  Genitourinary:  Negative for urgency.       Slow stream No sexual concerns  Musculoskeletal:  Negative for arthralgias, back pain and joint swelling.  Skin:        Itchy spots on back since shingles  Allergic/Immunologic: Negative for environmental allergies and immunocompromised state.  Neurological:  Positive for dizziness. Negative for syncope.       Balance problems from neuropathy Rare headaches  Hematological:  Negative for adenopathy. Bruises/bleeds easily.  Psychiatric/Behavioral:  Negative for sleep disturbance. The patient is nervous/anxious.        Mild depression---still plans to find a psychiatrist (needs ground floor due to elevator fear)       Objective:   Physical Exam Constitutional:      Appearance: Normal appearance. He is obese.  HENT:     Mouth/Throat:     Pharynx: No oropharyngeal exudate or  posterior oropharyngeal erythema.  Eyes:     Conjunctiva/sclera: Conjunctivae normal.     Pupils: Pupils are equal, round, and reactive to light.  Cardiovascular:     Rate and Rhythm: Normal rate and regular rhythm.     Pulses: Normal pulses.     Heart sounds: No murmur heard.    No gallop.  Pulmonary:     Effort: Pulmonary effort is normal.     Breath sounds: Normal breath sounds. No wheezing or rales.  Abdominal:     Palpations: Abdomen is soft.  Tenderness: There is no abdominal tenderness.  Musculoskeletal:     Cervical back: Neck supple.     Right lower leg: No edema.     Left lower leg: No edema.  Lymphadenopathy:     Cervical: No cervical adenopathy.  Skin:    Findings: No lesion or rash.  Neurological:     General: No focal deficit present.     Mental Status: He is alert and oriented to person, place, and time.  Psychiatric:        Mood and Affect: Mood normal.        Behavior: Behavior normal.            Assessment & Plan:

## 2023-12-30 NOTE — Assessment & Plan Note (Signed)
 Discussed at least some exercise Still wants to defer colon cancer screening No PSA due to age Td, shingrix at pharmacy Consider prevnar 20 Flu/COVID in the fall

## 2024-01-03 DIAGNOSIS — H6063 Unspecified chronic otitis externa, bilateral: Secondary | ICD-10-CM | POA: Diagnosis not present

## 2024-01-03 DIAGNOSIS — H6123 Impacted cerumen, bilateral: Secondary | ICD-10-CM | POA: Diagnosis not present

## 2024-01-04 ENCOUNTER — Other Ambulatory Visit: Payer: Self-pay | Admitting: Internal Medicine

## 2024-01-14 ENCOUNTER — Encounter: Payer: Self-pay | Admitting: Advanced Practice Midwife

## 2024-02-01 ENCOUNTER — Other Ambulatory Visit: Payer: Self-pay | Admitting: Internal Medicine

## 2024-02-01 NOTE — Telephone Encounter (Signed)
 Last filled 12-30-23 #60 Last OV 12-30-23 Next OV 07-05-24 [facility]

## 2024-02-23 ENCOUNTER — Other Ambulatory Visit: Payer: Self-pay | Admitting: Internal Medicine

## 2024-02-23 NOTE — Telephone Encounter (Signed)
 Last filled 02-02-24 #60 Last OV 12-30-23 Next OV 07-05-24 [facility]  Pt says he is wanting to pick everything up at the same time. Says it usually last about 25 days. Sometimes fewer days depending on how he has to take it.

## 2024-03-16 ENCOUNTER — Ambulatory Visit

## 2024-03-20 ENCOUNTER — Ambulatory Visit: Payer: Self-pay

## 2024-03-20 NOTE — Telephone Encounter (Signed)
 I spoke with pt; pt said he wants to wait couple of days to see if abd pain gets better before he schedules an appt. Offered to schedule appt for pt but he said he would cb if needs appt. UC & ED precautions also given again and pt voiced understanding, sending note to Dr KANDICE who pt already has TOC appt in 06/2024.

## 2024-03-20 NOTE — Telephone Encounter (Signed)
 FYI Only or Action Required?: FYI only for provider.  Patient was last seen in primary care on 12/30/2023 by Jimmy Charlie FERNS, MD.  Called Nurse Triage reporting Abdominal Pain.  Symptoms began a week ago.  Interventions attempted: Nothing.  Symptoms are: unchanged.  Triage Disposition: See Physician Within 24 Hours  Patient/caregiver understands and will follow disposition?: Yes  States will call back to make an appointment.    Copied from CRM [id number]. Topic: Clinical - Red Word Triage >> Mar 20, 2024 11:54 AM Frederich PARAS wrote: [facility] that prompted transfer to Nurse Triage: abdominal pain  About a week, after  the eats, pain sometimes not bad then it goes up to a 4 or a 5, no fever, may have been constipated but having abdominal pain Reason for Disposition  [1] MODERATE pain (e.g., interferes with normal activities) AND [2] pain comes and goes (cramps) AND [3] present > 24 hours  (Exception: Pain with Vomiting or Diarrhea - see that Guideline.)  Answer Assessment - Initial Assessment Questions 1. LOCATION: Where does it hurt?      States has constipation, states in stomach. 2. RADIATION: Does the pain shoot anywhere else? (e.g., chest, back)     denies 3. ONSET: When did the pain begin? (Minutes, hours or days ago)      A week ago 4. SUDDEN: Gradual or sudden onset?     constant 5. PATTERN Does the pain come and go, or is it constant?     After he eats has pain 6. SEVERITY: How bad is the pain?  (e.g., Scale 1-10; mild, moderate, or severe)     Mild to moderate 7. RECURRENT SYMPTOM: Have you ever had this type of stomach pain before? If Yes, ask: When was the last time? and What happened that time?      denies 8. CAUSE: What do you think is causing the stomach pain? (e.g., gallstones, recent abdominal surgery)     unknown 9. RELIEVING/AGGRAVATING FACTORS: What makes it better or worse? (e.g., antacids, bending or twisting motion, bowel movement)      eating 10. OTHER SYMPTOMS: Do you have any other symptoms? (e.g., back pain, diarrhea, fever, urination pain, vomiting)       denies  Protocols used: Abdominal Pain - Male-A-AH

## 2024-03-20 NOTE — Telephone Encounter (Signed)
Noted. Agree with recommendations made.

## 2024-03-30 ENCOUNTER — Other Ambulatory Visit: Payer: Self-pay

## 2024-03-30 MED ORDER — ALPRAZOLAM 0.5 MG PO TABS: 0.5000 mg | ORAL_TABLET | Freq: Two times a day (BID) | ORAL | 0 refills | Status: DC | PRN

## 2024-04-06 ENCOUNTER — Other Ambulatory Visit: Payer: Self-pay

## 2024-04-06 MED ORDER — BUSPIRONE HCL 5 MG PO TABS: ORAL_TABLET | ORAL | 2 refills | Status: DC

## 2024-04-06 NOTE — Telephone Encounter (Signed)
 Rx sent electronically.

## 2024-05-02 ENCOUNTER — Other Ambulatory Visit: Payer: Self-pay | Admitting: Family

## 2024-05-04 ENCOUNTER — Other Ambulatory Visit: Payer: Self-pay | Admitting: Family Medicine

## 2024-05-04 MED ORDER — ALPRAZOLAM 0.5 MG PO TABS: 0.5000 mg | ORAL_TABLET | Freq: Two times a day (BID) | ORAL | 0 refills | Status: DC | PRN

## 2024-05-04 NOTE — Telephone Encounter (Unsigned)
 Copied from CRM [id number]. Topic: Clinical - Medication Refill >> May 04, 2024  2:43 PM Rosina BIRCH wrote: Medication: ALPRAZolam  (XANAX ) 0.5 MG tablet  Has the patient contacted their pharmacy? Yes (Agent: If no, request that the patient contact the pharmacy for the refill. If patient does not wish to contact the pharmacy document the reason why and proceed with request.) (Agent: If yes, when and what did the pharmacy advise?)  This is the patient's preferred pharmacy:  [facility] [id number] [place], [place] - [address] [place] [address] Phone: [phone] Fax: [phone]  Is this the correct pharmacy for this prescription? Yes If no, delete pharmacy and type the correct one.   Has the prescription been filled recently? Yes  Is the patient out of the medication? No  Has the patient been seen for an appointment in the last year OR does the patient have an upcoming appointment? Yes  Can we respond through MyChart? Yes  Agent: Please be advised that Rx refills may take up to 3 business days. We ask that you follow-up with your pharmacy.

## 2024-05-30 ENCOUNTER — Other Ambulatory Visit: Payer: Self-pay | Admitting: Family

## 2024-06-30 ENCOUNTER — Other Ambulatory Visit: Payer: Self-pay | Admitting: Family

## 2024-07-03 ENCOUNTER — Telehealth: Payer: Self-pay | Admitting: Family Medicine

## 2024-07-03 MED ORDER — SPIRONOLACTONE 25 MG PO TABS: 25.0000 mg | ORAL_TABLET | Freq: Every day | ORAL | 3 refills | Status: AC

## 2024-07-03 NOTE — Telephone Encounter (Signed)
 ERx

## 2024-07-03 NOTE — Telephone Encounter (Signed)
 Received refill request from pharmacy for spironolactone  50mg  daily. According to latest office visit, he's taking spironolactone  25mg  daily.  Will refill accordingly

## 2024-07-05 ENCOUNTER — Encounter: Payer: Self-pay | Admitting: Family Medicine

## 2024-07-05 ENCOUNTER — Ambulatory Visit: Admitting: Family Medicine

## 2024-07-05 VITALS — BP 146/80 | HR 111 | Temp 98.0°F | Ht 68.5 in | Wt 240.6 lb

## 2024-07-05 DIAGNOSIS — K7030 Alcoholic cirrhosis of liver without ascites: Secondary | ICD-10-CM

## 2024-07-05 DIAGNOSIS — F39 Unspecified mood [affective] disorder: Secondary | ICD-10-CM | POA: Diagnosis not present

## 2024-07-05 DIAGNOSIS — F1021 Alcohol dependence, in remission: Secondary | ICD-10-CM | POA: Diagnosis not present

## 2024-07-05 DIAGNOSIS — F419 Anxiety disorder, unspecified: Secondary | ICD-10-CM

## 2024-07-05 DIAGNOSIS — R634 Abnormal weight loss: Secondary | ICD-10-CM

## 2024-07-05 DIAGNOSIS — R4589 Other symptoms and signs involving emotional state: Secondary | ICD-10-CM

## 2024-07-05 DIAGNOSIS — G621 Alcoholic polyneuropathy: Secondary | ICD-10-CM

## 2024-07-05 DIAGNOSIS — F40240 Claustrophobia: Secondary | ICD-10-CM

## 2024-07-05 DIAGNOSIS — I10 Essential (primary) hypertension: Secondary | ICD-10-CM | POA: Diagnosis not present

## 2024-07-05 DIAGNOSIS — R1013 Epigastric pain: Secondary | ICD-10-CM | POA: Diagnosis not present

## 2024-07-05 DIAGNOSIS — R7989 Other specified abnormal findings of blood chemistry: Secondary | ICD-10-CM

## 2024-07-05 LAB — AMMONIA: Ammonia: 31 umol/L (ref 11–35)

## 2024-07-05 LAB — CBC WITH DIFFERENTIAL/PLATELET
Basophils Absolute: 0.1 K/uL (ref 0.0–0.1)
Basophils Relative: 0.6 % (ref 0.0–3.0)
Eosinophils Absolute: 0.1 K/uL (ref 0.0–0.7)
Eosinophils Relative: 0.6 % (ref 0.0–5.0)
HCT: 48.3 % (ref 39.0–52.0)
Hemoglobin: 16 g/dL (ref 13.0–17.0)
Lymphocytes Relative: 11.7 % — ABNORMAL LOW (ref 12.0–46.0)
Lymphs Abs: 1 K/uL (ref 0.7–4.0)
MCHC: 33.2 g/dL (ref 30.0–36.0)
MCV: 93.3 fl (ref 78.0–100.0)
Monocytes Absolute: 0.7 K/uL (ref 0.1–1.0)
Monocytes Relative: 8.2 % (ref 3.0–12.0)
Neutro Abs: 6.8 K/uL (ref 1.4–7.7)
Neutrophils Relative %: 78.9 % — ABNORMAL HIGH (ref 43.0–77.0)
Platelets: 266 K/uL (ref 150.0–400.0)
RBC: 5.17 Mil/uL (ref 4.22–5.81)
RDW: 13.4 % (ref 11.5–15.5)
WBC: 8.6 K/uL (ref 4.0–10.5)

## 2024-07-05 LAB — COMPREHENSIVE METABOLIC PANEL WITH GFR
ALT: 52 U/L (ref 3–53)
AST: 29 U/L (ref 5–37)
Albumin: 4.4 g/dL (ref 3.5–5.2)
Alkaline Phosphatase: 76 U/L (ref 39–117)
BUN: 15 mg/dL (ref 6–23)
CO2: 28 meq/L (ref 19–32)
Calcium: 9.4 mg/dL (ref 8.4–10.5)
Chloride: 100 meq/L (ref 96–112)
Creatinine, Ser: 0.88 mg/dL (ref 0.40–1.50)
GFR: 85.11 mL/min
Glucose, Bld: 138 mg/dL — ABNORMAL HIGH (ref 70–99)
Potassium: 4.7 meq/L (ref 3.5–5.1)
Sodium: 136 meq/L (ref 135–145)
Total Bilirubin: 0.4 mg/dL (ref 0.2–1.2)
Total Protein: 7.5 g/dL (ref 6.0–8.3)

## 2024-07-05 LAB — PROTIME-INR
INR: 1.2 ratio — ABNORMAL HIGH (ref 0.8–1.0)
Prothrombin Time: 12.7 s (ref 9.6–13.1)

## 2024-07-05 LAB — VITAMIN B12: Vitamin B-12: 230 pg/mL (ref 211–911)

## 2024-07-05 NOTE — Progress Notes (Signed)
 " Ph: [phone] Fax: [phone]   Patient ID: Caleb Arellano Boehringer, male    DOB: 1951/06/03, 74 y.o.   MRN: [id number]  This visit was conducted in person.  BP (!) 146/80 (BP Location: Left Arm, Patient Position: Sitting, Cuff Size: Large)   Pulse (!) 111   Temp 98 F (36.7 C) (Oral)   Ht 5' 8.5 (1.74 m)   Wt 240 lb 9.6 oz (109.1 kg)   SpO2 99%   BMI 36.05 kg/m   BP Readings from Last 3 Encounters:  07/05/24 (!) 146/80  12/30/23 (!) 152/70  09/30/23 (!) 158/84   CC: transfer of care visit Subjective:   HPI: Caleb Arellano is a 74 y.o. male presenting on 07/05/2024 for Transitions Of Care (Pt went on gallbladder diet September 2025 and has lost 30 lbs. But still gets the pains and belching gas occasionally  )   Previously saw Dr Jimmy.  Last physical was 12/2023.   GAD with panic attacks worse over the past several years as well as claustrophobia - on buspar  2.5 bid and xanax  0.5mg  1 tablet BID PRN. Overall feels anxiety well controlled with this regimen. Last visit with prior PCP - was still looking for psychiatrist. Notes ongoing depressed mood but declines antidepressant.   02/2024 developed RUQ abd pain. He started gallbladder diet -poached fish, grilled chicken, vegetables, and beans - notes increased gassiness. Notes upper abd pain improves when passing gas. Occ constipation managed with miralax. Has lost 23 lbs. Some epigastric abd pain.  No fevers/chills, nausea/vomiting, jaundice or icterus, diarrhea.   Last CT scan 04/2022 - gallstones, liver cirrhosis (known since 2007 - stopped alcohol at that time).   Also with BLE neuropathy since 2023, thought alcohol related although he quit 2007. Gabapentin previously caused dizziness.   HTN - on valsartan  60mg  daily and spironolactone  25mg  daily (takes PRN as he feels it dries him out). Attributes elevated BP readings due to anxiety, h/o white coat hypertension.      Relevant past medical, surgical, family and social  history reviewed and updated as indicated. Interim medical history since our last visit reviewed. Allergies and medications reviewed and updated. Outpatient Medications Prior to Visit  Medication Sig Dispense Refill   ALPRAZolam  (XANAX ) 0.5 MG tablet TAKE 1 TO 2 TABLETS BY MOUTH 2 TIMES A DAY AS NEEDED FOR ANXIETY 60 tablet 0   hydrocortisone  2.5 % cream Apply topically 2 (two) times daily as needed. 30 g 1   ketoconazole  (NIZORAL ) 2 % shampoo Apply 1 application topically 2 (two) times a week. (Patient taking differently: Apply 1 application  topically as needed.) 120 mL 11   mometasone (ELOCON) 0.1 % cream Apply 1 Application topically daily.     spironolactone  (ALDACTONE ) 25 MG tablet Take 1 tablet (25 mg total) by mouth daily. (Patient taking differently: Take 25 mg by mouth daily. Only takes 1/2 tablet sometimes when throat gets dry) 90 tablet 3   busPIRone  (BUSPAR ) 5 MG tablet TAKE 1/2 TO 1 TABLETS BY MOUTH 2 TIMES A DAY 60 tablet 2   valsartan  (DIOVAN ) 160 MG tablet Take 1 tablet (160 mg total) by mouth daily. 90 tablet 3   chlorhexidine (PERIDEX) 0.12 % solution Use as directed 5 mLs in the mouth or throat 2 (two) times daily. (Patient not taking: Reported on 07/05/2024)     meclizine  (ANTIVERT ) 12.5 MG tablet Take 1 tablet (12.5 mg total) by mouth 3 (three) times daily as needed for dizziness. (Patient not taking:  Reported on 07/05/2024) 21 tablet 0   folic acid  (FOLVITE ) 1 MG tablet TAKE 1 TABLET BY MOUTH EVERY DAY (Patient not taking: Reported on 07/05/2024) 90 tablet 3   thiamine (VITAMIN B-1) 100 MG tablet Take 100 mg by mouth daily.  (Patient not taking: Reported on 07/05/2024)     No facility-administered medications prior to visit.     Per HPI unless specifically indicated in ROS section below Review of Systems  Objective:  BP (!) 146/80 (BP Location: Left Arm, Patient Position: Sitting, Cuff Size: Large)   Pulse (!) 111   Temp 98 F (36.7 C) (Oral)   Ht 5' 8.5 (1.74 m)   Wt 240  lb 9.6 oz (109.1 kg)   SpO2 99%   BMI 36.05 kg/m   Wt Readings from Last 3 Encounters:  07/05/24 240 lb 9.6 oz (109.1 kg)  12/30/23 263 lb 4 oz (119.4 kg)  09/30/23 266 lb (120.7 kg)      Physical Exam Vitals and nursing note reviewed.  Constitutional:      Appearance: Normal appearance. He is not ill-appearing.  HENT:     Head: Normocephalic and atraumatic.     Mouth/Throat:     Mouth: Mucous membranes are moist.     Pharynx: Oropharynx is clear. No oropharyngeal exudate or posterior oropharyngeal erythema.  Eyes:     Extraocular Movements: Extraocular movements intact.     Conjunctiva/sclera: Conjunctivae normal.     Pupils: Pupils are equal, round, and reactive to light.  Cardiovascular:     Rate and Rhythm: Normal rate and regular rhythm.     Pulses: Normal pulses.     Heart sounds: Normal heart sounds. No murmur heard. Pulmonary:     Effort: Pulmonary effort is normal. No respiratory distress.     Breath sounds: Normal breath sounds. No wheezing, rhonchi or rales.  Abdominal:     General: Bowel sounds are normal. There is distension (mild).     Palpations: Abdomen is soft. There is no mass.     Tenderness: There is abdominal tenderness (mild) in the right upper quadrant and epigastric area. There is no guarding or rebound.     Hernia: No hernia is present.     Comments: No obvious HSM  Musculoskeletal:     Cervical back: Normal range of motion and neck supple.     Right lower leg: No edema.     Left lower leg: No edema.  Skin:    General: Skin is warm and dry.     Findings: No rash.  Neurological:     Mental Status: He is alert.  Psychiatric:        Mood and Affect: Mood normal.        Behavior: Behavior normal.       Results for orders placed or performed in visit on 07/05/24  CBC with Differential/Platelet   Collection Time: 07/05/24 12:49 PM  Result Value Ref Range   WBC 8.6 4.0 - 10.5 K/uL   RBC 5.17 4.22 - 5.81 Mil/uL   Hemoglobin 16.0 13.0 - 17.0  g/dL   HCT 51.6 60.9 - 47.9 %   MCV 93.3 78.0 - 100.0 fl   MCHC 33.2 30.0 - 36.0 g/dL   RDW 86.5 88.4 - 84.4 %   Platelets 266.0 150.0 - 400.0 K/uL   Neutrophils Relative % 78.9 (H) 43.0 - 77.0 %   Lymphocytes Relative 11.7 (L) 12.0 - 46.0 %   Monocytes Relative 8.2 3.0 - 12.0 %  Eosinophils Relative 0.6 0.0 - 5.0 %   Basophils Relative 0.6 0.0 - 3.0 %   Neutro Abs 6.8 1.4 - 7.7 K/uL   Lymphs Abs 1.0 0.7 - 4.0 K/uL   Monocytes Absolute 0.7 0.1 - 1.0 K/uL   Eosinophils Absolute 0.1 0.0 - 0.7 K/uL   Basophils Absolute 0.1 0.0 - 0.1 K/uL  Comprehensive metabolic panel with GFR   Collection Time: 07/05/24 12:49 PM  Result Value Ref Range   Sodium 136 135 - 145 mEq/L   Potassium 4.7 3.5 - 5.1 mEq/L   Chloride 100 96 - 112 mEq/L   CO2 28 19 - 32 mEq/L   Glucose, Bld 138 (H) 70 - 99 mg/dL   BUN 15 6 - 23 mg/dL   Creatinine, Ser 9.11 0.40 - 1.50 mg/dL   Total Bilirubin 0.4 0.2 - 1.2 mg/dL   Alkaline Phosphatase 76 39 - 117 U/L   AST 29 5 - 37 U/L   ALT 52 3 - 53 U/L   Total Protein 7.5 6.0 - 8.3 g/dL   Albumin 4.4 3.5 - 5.2 g/dL   GFR 14.88 >39.99 mL/min   Calcium 9.4 8.4 - 10.5 mg/dL  Ammonia   Collection Time: 07/05/24 12:49 PM  Result Value Ref Range   Ammonia 31 11 - 35 umol/L  Protime-INR   Collection Time: 07/05/24 12:49 PM  Result Value Ref Range   INR 1.2 (H) 0.8 - 1.0 ratio   Prothrombin Time 12.7 9.6 - 13.1 sec  Vitamin B12   Collection Time: 07/05/24 12:49 PM  Result Value Ref Range   Vitamin B-12 230 211 - 911 pg/mL      07/05/2024   12:28 PM 12/30/2023   10:32 AM 03/10/2023   11:51 AM 12/08/2022   12:23 PM 11/28/2021   12:15 PM  Depression screen PHQ 2/9  Decreased Interest 3 1 2 2  0  Down, Depressed, Hopeless 2 1 1 2  0  PHQ - 2 Score 5 2 3 4  0  Altered sleeping 2 0 2 1   Tired, decreased energy 2 0 1 1   Change in appetite 1 2 1 2    Feeling bad or failure about yourself  1 2 2 2    Trouble concentrating 1 2 1  0   Moving slowly or fidgety/restless  0 0 0    Suicidal thoughts 0  0 0   PHQ-9 Score 12 8  10  10     Difficult doing work/chores Somewhat difficult Somewhat difficult Very difficult Somewhat difficult      Data saved with a previous flowsheet row definition       07/05/2024   12:28 PM 03/10/2023   11:52 AM  GAD 7 : Generalized Anxiety Score  Nervous, Anxious, on Edge 2 2  Control/stop worrying 1 2  Worry too much - different things 2 2  Trouble relaxing 2 2  Restless 0 0  Easily annoyed or irritable 1 1  Afraid - awful might happen 2 2  Total GAD 7 Score 10 11  Anxiety Difficulty Very difficult Very difficult   Assessment & Plan:   Problem List Items Addressed This Visit     Alcoholic peripheral neuropathy   Symptoms previously presumed alcoholic neuropathy related - worsened 2023.  He quit drinking in 2007.  He stopped vitamin replacement - will update vit B12 levels.  Consider further evaluation including SPEP, NCS.       Relevant Medications   busPIRone  (BUSPAR ) 5 MG tablet   Other  Relevant Orders   Vitamin B12 (Completed)   Essential hypertension, benign   BP improves on recheck, adequate for him. He continues valsartan  160mg  daily regularly, and spironolactone  1/2 tab but not regularly. Will continue to monitor without med change today. Consider loop diuretic vs nonselective BB in h/o cirrhosis       Relevant Medications   valsartan  (DIOVAN ) 160 MG tablet   Hepatic cirrhosis (HCC) - Primary   H/o alcoholic cirrhosis since 2007, he quit drinking at that time.  Due for hepatoma screening With recent weight loss, will check contrasted CT abd/pelvis.  Update labs today.  MELD 3.0: 8 at 07/05/2024 12:49 PM MELD-Na: 8 at 07/05/2024 12:49 PM Calculated from: Serum Creatinine: 0.88 mg/dL (Using min of 1 mg/dL) at 01/29/7972 87:50 PM Serum Sodium: 136 mEq/L at 07/05/2024 12:49 PM Total Bilirubin: 0.4 mg/dL (Using min of 1 mg/dL) at 01/29/7972 87:50 PM Serum Albumin: 4.4 g/dL (Using max of 3.5 g/dL) at 01/29/7972 87:50  PM INR(ratio): 1.2 ratio at 07/05/2024 12:49 PM Age at listing (hypothetical): 38 years Sex: Male at 07/05/2024 12:49 PM       Relevant Orders   CBC with Differential/Platelet (Completed)   Comprehensive metabolic panel with GFR (Completed)   Ammonia (Completed)   Protime-INR (Completed)   CT ABDOMEN PELVIS W CONTRAST   Alcohol dependence in remission (HCC)   Abstinent since 2007      Mood disorder   Chronic, anxiety > depression but depressed mood also present.  Overall stable period on buspar  2.5mg  bid with PRN xanax   Offered antidepressant - declines Agrees to psychology referral. Due to his claustrophobia, he needs therapist on 1st floor as he avoids elevator use.       Relevant Orders   Ambulatory referral to Psychology   Severe obesity (BMI 35.0-39.9) with comorbidity (HCC)   Endorses 23 lb weight loss since changing diet to gallstone diet 02/2024. BMI has dropped from 39 to 35. Feels changes are sustainable.  See above. Update abdominal imaging      Claustrophobia   Difficulty with elevators, any confined space      Relevant Medications   busPIRone  (BUSPAR ) 5 MG tablet   Low vitamin B12 level   Update labs - low normal. Rec restart b12 1000mcg daily.       Other Visit Diagnoses       Epigastric pain       Relevant Orders   CT ABDOMEN PELVIS W CONTRAST     Weight loss       Relevant Orders   CT ABDOMEN PELVIS W CONTRAST        Meds ordered this encounter  Medications   busPIRone  (BUSPAR ) 5 MG tablet    Sig: Take 0.5 tablets (2.5 mg total) by mouth 2 (two) times daily.    Dispense:  90 tablet    Refill:  3   valsartan  (DIOVAN ) 160 MG tablet    Sig: Take 1 tablet (160 mg total) by mouth daily.    Dispense:  90 tablet    Refill:  3   cyanocobalamin  (VITAMIN B12) 1000 MCG tablet    Sig: Take 1 tablet (1,000 mcg total) by mouth daily.    Orders Placed This Encounter  Procedures   CT ABDOMEN PELVIS W CONTRAST    Standing Status:   Future     Expiration Date:   07/05/2025    If indicated for the ordered procedure, I authorize the administration of contrast media per Radiology protocol:   Yes  Does the patient have a contrast media/X-ray dye allergy?:   No    Preferred imaging location?:   [facility]    If indicated for the ordered procedure, I authorize the administration of oral contrast media per Radiology protocol:   Yes   CBC with Differential/Platelet   Comprehensive metabolic panel with GFR   Ammonia   Protime-INR   Vitamin B12   Ambulatory referral to Psychology    Referral Priority:   Routine    Referral Type:   Psychiatric    Referral Reason:   Specialty Services Required    Requested Specialty:   Psychology    Number of Visits Requested:   1    Patient Instructions  Labs today  I will order liver imaging to outpatient imaging. Call centralized scheduling at  to schedule appointment: [phone] .  I will refer you to our counselor Darice Seats Good to see you today Return as needed or in 3 months for follow up visit   Follow up plan: Return in about 3 months (around 10/03/2024) for follow up visit.  Anton Blas, MD   "

## 2024-07-05 NOTE — Patient Instructions (Addendum)
 Labs today  I will order liver imaging to outpatient imaging. Call centralized scheduling at [facility] to schedule appointment: [phone] .  I will refer you to our counselor Darice Seats Good to see you today Return as needed or in 3 months for follow up visit

## 2024-07-06 ENCOUNTER — Ambulatory Visit: Payer: Self-pay | Admitting: Family Medicine

## 2024-07-06 ENCOUNTER — Encounter: Payer: Self-pay | Admitting: Family Medicine

## 2024-07-06 DIAGNOSIS — R7989 Other specified abnormal findings of blood chemistry: Secondary | ICD-10-CM | POA: Insufficient documentation

## 2024-07-06 DIAGNOSIS — R4589 Other symptoms and signs involving emotional state: Secondary | ICD-10-CM | POA: Insufficient documentation

## 2024-07-06 DIAGNOSIS — F40240 Claustrophobia: Secondary | ICD-10-CM | POA: Insufficient documentation

## 2024-07-06 MED ORDER — BUSPIRONE HCL 5 MG PO TABS: 2.5000 mg | ORAL_TABLET | Freq: Two times a day (BID) | ORAL | 3 refills | Status: AC

## 2024-07-06 MED ORDER — VITAMIN B-12 1000 MCG PO TABS: 1000.0000 ug | ORAL_TABLET | Freq: Every day | ORAL | Status: AC

## 2024-07-06 MED ORDER — VALSARTAN 160 MG PO TABS: 160.0000 mg | ORAL_TABLET | Freq: Every day | ORAL | 3 refills | Status: AC

## 2024-07-06 NOTE — Assessment & Plan Note (Addendum)
 BP improves on recheck, adequate for him. He continues valsartan  160mg  daily regularly, and spironolactone  1/2 tab but not regularly. Will continue to monitor without med change today. Consider loop diuretic vs nonselective BB in h/o cirrhosis

## 2024-07-06 NOTE — Assessment & Plan Note (Addendum)
 H/o alcoholic cirrhosis since 2007, he quit drinking at that time.  Due for hepatoma screening With recent weight loss, will check contrasted CT abd/pelvis.  Update labs today.  MELD 3.0: 8 at 07/05/2024 12:49 PM MELD-Na: 8 at 07/05/2024 12:49 PM Calculated from: Serum Creatinine: 0.88 mg/dL (Using min of 1 mg/dL) at 01/29/7972 87:50 PM Serum Sodium: 136 mEq/L at 07/05/2024 12:49 PM Total Bilirubin: 0.4 mg/dL (Using min of 1 mg/dL) at 01/29/7972 87:50 PM Serum Albumin: 4.4 g/dL (Using max of 3.5 g/dL) at 01/29/7972 87:50 PM INR(ratio): 1.2 ratio at 07/05/2024 12:49 PM Age at listing (hypothetical): 4 years Sex: Male at 07/05/2024 12:49 PM

## 2024-07-06 NOTE — Assessment & Plan Note (Signed)
 Endorses 23 lb weight loss since changing diet to gallstone diet 02/2024. BMI has dropped from 39 to 35. Feels changes are sustainable.  See above. Update abdominal imaging

## 2024-07-06 NOTE — Assessment & Plan Note (Signed)
 Difficulty with elevators, any confined space

## 2024-07-06 NOTE — Assessment & Plan Note (Addendum)
 Symptoms previously presumed alcoholic neuropathy related - worsened 2023.  He quit drinking in 2007.  He stopped vitamin replacement - will update vit B12 levels.  Consider further evaluation including SPEP, NCS.

## 2024-07-06 NOTE — Assessment & Plan Note (Signed)
 Update labs - low normal. Rec restart b12 1000mcg daily.

## 2024-07-06 NOTE — Assessment & Plan Note (Signed)
 Chronic, anxiety > depression but depressed mood also present.  Overall stable period on buspar  2.5mg  bid with PRN xanax   Offered antidepressant - declines Agrees to psychology referral. Due to his claustrophobia, he needs therapist on 1st floor as he avoids elevator use.

## 2024-07-06 NOTE — Assessment & Plan Note (Signed)
 Abstinent since 2007

## 2024-07-17 ENCOUNTER — Other Ambulatory Visit: Payer: Self-pay | Admitting: Family

## 2024-08-02 ENCOUNTER — Other Ambulatory Visit: Payer: Self-pay | Admitting: Family Medicine

## 2024-08-02 NOTE — Telephone Encounter (Signed)
 Requesting: Alprazolam   Contract: No UDS:  Last Visit: 07/05/2024 Next Visit: 10/04/2024 Last Refill: 07/03/24  Please Advise

## 2024-08-02 NOTE — Telephone Encounter (Signed)
 Copied from CRM [id number]. Topic: Clinical - Medication Question >> Aug 02, 2024  9:05 AM Revonda D wrote: Reason for CRM: Pt just submitted a refill request for the ALPRAZolam  (XANAX ) 0.5 MG tablet and wanted to know if the request could be approved and sent to the pharmacy today if possible. Pt stated that it might be bad weather again this week and that's the reason he would like the medication today.

## 2024-08-02 NOTE — Telephone Encounter (Unsigned)
 Copied from CRM #[id number]. Topic: Clinical - Medication Refill >> Aug 02, 2024  9:02 AM Revonda D wrote: Medication: ALPRAZolam  (XANAX ) 0.5 MG tablet  Has the patient contacted their pharmacy? Yes (Agent: If no, request that the patient contact the pharmacy for the refill. If patient does not wish to contact the pharmacy document the reason why and proceed with request.) (Agent: If yes, when and what did the pharmacy advise?)  This is the patient's preferred pharmacy:  [id number] [place], [place] - [address] [place] [address] Phone: [phone] Fax: [phone]  Is this the correct pharmacy for this prescription? Yes If no, delete pharmacy and type the correct one.   Has the prescription been filled recently? No  Is the patient out of the medication? No  Has the patient been seen for an appointment in the last year OR does the patient have an upcoming appointment? Yes  Can we respond through MyChart? Yes  Agent: Please be advised that Rx refills may take up to 3 business days. We ask that you follow-up with your pharmacy.

## 2024-08-03 MED ORDER — ALPRAZOLAM 0.5 MG PO TABS: 0.5000 mg | ORAL_TABLET | Freq: Two times a day (BID) | ORAL | 0 refills | Status: AC | PRN

## 2024-08-03 NOTE — Telephone Encounter (Signed)
 ERx

## 2024-09-28 ENCOUNTER — Ambulatory Visit

## 2024-10-04 ENCOUNTER — Ambulatory Visit: Admitting: Family Medicine
